# Patient Record
Sex: Female | Born: 1965 | Race: White | Hispanic: No | Marital: Married | Smoking: Never smoker
Health system: Southern US, Community
[De-identification: ages and names within clinical notes are randomized; demographics above are authoritative.]

## PROBLEM LIST (undated history)

## (undated) DIAGNOSIS — Z9889 Other specified postprocedural states: Secondary | ICD-10-CM

## (undated) DIAGNOSIS — Z808 Family history of malignant neoplasm of other organs or systems: Secondary | ICD-10-CM

## (undated) DIAGNOSIS — Z973 Presence of spectacles and contact lenses: Secondary | ICD-10-CM

## (undated) DIAGNOSIS — R112 Nausea with vomiting, unspecified: Secondary | ICD-10-CM

## (undated) DIAGNOSIS — E039 Hypothyroidism, unspecified: Secondary | ICD-10-CM

## (undated) DIAGNOSIS — Z8042 Family history of malignant neoplasm of prostate: Secondary | ICD-10-CM

## (undated) DIAGNOSIS — Z803 Family history of malignant neoplasm of breast: Secondary | ICD-10-CM

## (undated) DIAGNOSIS — Z8041 Family history of malignant neoplasm of ovary: Secondary | ICD-10-CM

## (undated) DIAGNOSIS — I10 Essential (primary) hypertension: Secondary | ICD-10-CM

## (undated) DIAGNOSIS — Z8489 Family history of other specified conditions: Secondary | ICD-10-CM

## (undated) HISTORY — DX: Family history of malignant neoplasm of breast: Z80.3

## (undated) HISTORY — DX: Hypothyroidism, unspecified: E03.9

## (undated) HISTORY — PX: WRIST GANGLION EXCISION: SUR520

## (undated) HISTORY — DX: Family history of malignant neoplasm of prostate: Z80.42

## (undated) HISTORY — DX: Family history of malignant neoplasm of other organs or systems: Z80.8

## (undated) HISTORY — PX: WISDOM TOOTH EXTRACTION: SHX21

## (undated) HISTORY — DX: Family history of malignant neoplasm of ovary: Z80.41

---

## 1998-09-15 ENCOUNTER — Other Ambulatory Visit: Admission: RE | Admit: 1998-09-15 | Discharge: 1998-09-15 | Payer: Self-pay | Admitting: *Deleted

## 1999-04-11 ENCOUNTER — Inpatient Hospital Stay (HOSPITAL_COMMUNITY): Admission: AD | Admit: 1999-04-11 | Discharge: 1999-04-14 | Payer: Self-pay | Admitting: Obstetrics and Gynecology

## 1999-05-24 ENCOUNTER — Other Ambulatory Visit: Admission: RE | Admit: 1999-05-24 | Discharge: 1999-05-24 | Payer: Self-pay | Admitting: Obstetrics and Gynecology

## 1999-12-20 ENCOUNTER — Other Ambulatory Visit: Admission: RE | Admit: 1999-12-20 | Discharge: 1999-12-20 | Payer: Self-pay | Admitting: Obstetrics and Gynecology

## 2000-06-14 ENCOUNTER — Inpatient Hospital Stay (HOSPITAL_COMMUNITY): Admission: AD | Admit: 2000-06-14 | Discharge: 2000-06-14 | Payer: Self-pay | Admitting: Obstetrics and Gynecology

## 2000-07-03 ENCOUNTER — Inpatient Hospital Stay (HOSPITAL_COMMUNITY): Admission: AD | Admit: 2000-07-03 | Discharge: 2000-07-07 | Payer: Self-pay | Admitting: *Deleted

## 2000-08-10 ENCOUNTER — Other Ambulatory Visit: Admission: RE | Admit: 2000-08-10 | Discharge: 2000-08-10 | Payer: Self-pay | Admitting: Obstetrics and Gynecology

## 2001-02-12 ENCOUNTER — Other Ambulatory Visit: Admission: RE | Admit: 2001-02-12 | Discharge: 2001-02-12 | Payer: Self-pay | Admitting: Obstetrics and Gynecology

## 2001-08-01 ENCOUNTER — Encounter: Payer: Self-pay | Admitting: Obstetrics and Gynecology

## 2001-08-01 ENCOUNTER — Ambulatory Visit (HOSPITAL_COMMUNITY): Admission: RE | Admit: 2001-08-01 | Discharge: 2001-08-01 | Payer: Self-pay | Admitting: Obstetrics and Gynecology

## 2001-08-14 ENCOUNTER — Ambulatory Visit (HOSPITAL_COMMUNITY): Admission: AD | Admit: 2001-08-14 | Discharge: 2001-08-14 | Payer: Self-pay | Admitting: Obstetrics and Gynecology

## 2001-08-14 ENCOUNTER — Encounter: Payer: Self-pay | Admitting: Obstetrics and Gynecology

## 2001-08-15 ENCOUNTER — Inpatient Hospital Stay (HOSPITAL_COMMUNITY): Admission: AD | Admit: 2001-08-15 | Discharge: 2001-08-18 | Payer: Self-pay | Admitting: Obstetrics and Gynecology

## 2001-10-25 ENCOUNTER — Other Ambulatory Visit: Admission: RE | Admit: 2001-10-25 | Discharge: 2001-10-25 | Payer: Self-pay | Admitting: Obstetrics and Gynecology

## 2002-05-29 ENCOUNTER — Encounter: Payer: Self-pay | Admitting: Obstetrics and Gynecology

## 2002-05-29 ENCOUNTER — Encounter: Admission: RE | Admit: 2002-05-29 | Discharge: 2002-05-29 | Payer: Self-pay | Admitting: Obstetrics and Gynecology

## 2002-10-24 ENCOUNTER — Other Ambulatory Visit: Admission: RE | Admit: 2002-10-24 | Discharge: 2002-10-24 | Payer: Self-pay | Admitting: Obstetrics and Gynecology

## 2003-10-30 ENCOUNTER — Other Ambulatory Visit: Admission: RE | Admit: 2003-10-30 | Discharge: 2003-10-30 | Payer: Self-pay | Admitting: Obstetrics and Gynecology

## 2004-09-20 ENCOUNTER — Encounter: Admission: RE | Admit: 2004-09-20 | Discharge: 2004-09-20 | Payer: Self-pay | Admitting: Obstetrics and Gynecology

## 2004-11-08 ENCOUNTER — Other Ambulatory Visit: Admission: RE | Admit: 2004-11-08 | Discharge: 2004-11-08 | Payer: Self-pay | Admitting: Obstetrics and Gynecology

## 2005-11-23 ENCOUNTER — Other Ambulatory Visit: Admission: RE | Admit: 2005-11-23 | Discharge: 2005-11-23 | Payer: Self-pay | Admitting: Obstetrics and Gynecology

## 2006-01-02 ENCOUNTER — Encounter: Admission: RE | Admit: 2006-01-02 | Discharge: 2006-01-02 | Payer: Self-pay | Admitting: Obstetrics and Gynecology

## 2007-01-04 ENCOUNTER — Encounter: Admission: RE | Admit: 2007-01-04 | Discharge: 2007-01-04 | Payer: Self-pay | Admitting: Obstetrics and Gynecology

## 2007-12-17 ENCOUNTER — Emergency Department (HOSPITAL_COMMUNITY): Admission: EM | Admit: 2007-12-17 | Discharge: 2007-12-17 | Payer: Self-pay | Admitting: Emergency Medicine

## 2008-01-27 ENCOUNTER — Encounter: Admission: RE | Admit: 2008-01-27 | Discharge: 2008-01-27 | Payer: Self-pay | Admitting: Obstetrics and Gynecology

## 2008-02-28 HISTORY — PX: PLACEMENT OF BREAST IMPLANTS: SHX6334

## 2009-01-27 ENCOUNTER — Encounter: Admission: RE | Admit: 2009-01-27 | Discharge: 2009-01-27 | Payer: Self-pay | Admitting: Obstetrics and Gynecology

## 2010-03-30 ENCOUNTER — Encounter: Payer: Self-pay | Admitting: Obstetrics and Gynecology

## 2011-01-11 ENCOUNTER — Other Ambulatory Visit: Payer: Self-pay | Admitting: Obstetrics and Gynecology

## 2011-01-11 DIAGNOSIS — Z1231 Encounter for screening mammogram for malignant neoplasm of breast: Secondary | ICD-10-CM

## 2011-02-08 ENCOUNTER — Ambulatory Visit
Admission: RE | Admit: 2011-02-08 | Discharge: 2011-02-08 | Disposition: A | Discharge status: Home / Self Care | Payer: BC Managed Care – PPO | Source: Ambulatory Visit | Attending: Obstetrics and Gynecology | Admitting: Obstetrics and Gynecology

## 2011-02-08 DIAGNOSIS — Z1231 Encounter for screening mammogram for malignant neoplasm of breast: Secondary | ICD-10-CM

## 2011-02-15 ENCOUNTER — Other Ambulatory Visit: Payer: Self-pay | Admitting: Obstetrics and Gynecology

## 2011-02-15 DIAGNOSIS — R928 Other abnormal and inconclusive findings on diagnostic imaging of breast: Secondary | ICD-10-CM

## 2011-03-07 ENCOUNTER — Ambulatory Visit
Admission: RE | Admit: 2011-03-07 | Discharge: 2011-03-07 | Disposition: A | Discharge status: Home / Self Care | Payer: BC Managed Care – PPO | Source: Ambulatory Visit | Attending: Obstetrics and Gynecology | Admitting: Obstetrics and Gynecology

## 2011-03-07 DIAGNOSIS — R928 Other abnormal and inconclusive findings on diagnostic imaging of breast: Secondary | ICD-10-CM

## 2011-11-08 ENCOUNTER — Telehealth: Payer: Self-pay | Admitting: Obstetrics and Gynecology

## 2011-11-08 NOTE — Telephone Encounter (Signed)
LM for pt to call.  Unsure of msg that was left.  ld

## 2012-01-01 ENCOUNTER — Other Ambulatory Visit: Payer: Self-pay | Admitting: Obstetrics and Gynecology

## 2012-01-01 DIAGNOSIS — Z1231 Encounter for screening mammogram for malignant neoplasm of breast: Secondary | ICD-10-CM

## 2012-01-17 ENCOUNTER — Ambulatory Visit: Payer: Self-pay | Admitting: Obstetrics and Gynecology

## 2012-02-09 ENCOUNTER — Encounter: Payer: Self-pay | Admitting: Obstetrics and Gynecology

## 2012-02-09 ENCOUNTER — Other Ambulatory Visit: Payer: Self-pay | Admitting: Obstetrics and Gynecology

## 2012-02-09 ENCOUNTER — Ambulatory Visit (INDEPENDENT_AMBULATORY_CARE_PROVIDER_SITE_OTHER): Payer: BC Managed Care – PPO

## 2012-02-09 ENCOUNTER — Ambulatory Visit (INDEPENDENT_AMBULATORY_CARE_PROVIDER_SITE_OTHER): Payer: BC Managed Care – PPO | Admitting: Obstetrics and Gynecology

## 2012-02-09 VITALS — BP 120/68 | Ht 66.25 in | Wt 132.0 lb

## 2012-02-09 DIAGNOSIS — Z8543 Personal history of malignant neoplasm of ovary: Secondary | ICD-10-CM

## 2012-02-09 DIAGNOSIS — Z124 Encounter for screening for malignant neoplasm of cervix: Secondary | ICD-10-CM

## 2012-02-09 DIAGNOSIS — Z01419 Encounter for gynecological examination (general) (routine) without abnormal findings: Secondary | ICD-10-CM

## 2012-02-09 DIAGNOSIS — Z8041 Family history of malignant neoplasm of ovary: Secondary | ICD-10-CM | POA: Insufficient documentation

## 2012-02-09 MED ORDER — ETONOGESTREL-ETHINYL ESTRADIOL 0.12-0.015 MG/24HR VA RING: VAGINAL_RING | VAGINAL | Status: DC

## 2012-02-09 NOTE — Progress Notes (Signed)
Subjective:    Renee Keith is a 46 y.o. female, No obstetric history on file., who presents for an annual exam.   The patient reports:no complaints  Contraception:NuvaRing vaginal inserts  Last mammogram: was normal and not applicable January 2008. Mammogram sched 02/2012 Last pap: was normal and not applicable October  2012  GC/Chlamydia cultures offered: declined HIV/RPR/HbsAg offered:  declined HSV 1 and 2 glycoprotein offered: declined  Menstrual cycle regular and monthly: Yes Menstrual flow normal: No: less Cycles last approx 3 days vs 5days in past  Urinary symptoms: none Normal bowel movements: Yes Reports abuse at home: No:        History   Social History  . Marital Status: Married    Spouse Name: N/A    Number of Children: N/A  . Years of Education: N/A   Social History Main Topics  . Smoking status: Not on file  . Smokeless tobacco: Not on file  . Alcohol Use: Not on file  . Drug Use: Not on file  . Sexually Active: Not on file   Other Topics Concern  . Not on file   Social History Narrative  . No narrative on file    Menstrual cycle:   LMP: No LMP recorded.           Cycle: 28-30day  The following portions of the patient's history were reviewed and updated as appropriate: allergies, current medications, past family history, past medical history, past social history, past surgical history and problem list.  Review of Systems Pertinent items are noted in HPI. Breast:Negative for breast lump,nipple discharge or nipple retraction Gastrointestinal: Negative for abdominal pain, change in bowel habits or rectal bleeding Urinary:negative   Objective:    There were no vitals taken for this visit.    Weight:  Wt Readings from Last 1 Encounters:  No data found for Wt          BMI: There is no height or weight on file to calculate BMI.  General Appearance: Alert, appropriate appearance for age. No acute distress HEENT: Grossly normal Neck / Thyroid:  Supple, no masses, nodes or enlargement Lungs: clear to auscultation bilaterally Back: No CVA tenderness Breast Exam: No masses or nodes.No dimpling, nipple retraction or discharge. Cardiovascular: Regular rate and rhythm. S1, S2, no murmur Gastrointestinal: Soft, non-tender, no masses or organomegaly Pelvic Exam: Vulva and vagina appear normal. Bimanual exam reveals normal uterus and adnexa. Rectovaginal: normal rectal, no masses Lymphatic Exam: Non-palpable nodes in neck, clavicular, axillary, or inguinal regions Skin: no rash or abnormalities Neurologic: Normal gait and speech, no tremor  Psychiatric: Alert and oriented, appropriate affect.    Assessment:    Normal gyn exam family H/O ovarian cancer    Plan:    mammogram pap smear return annually or prn STD screening: declined Contraception:NuvaRing vaginal inserts Plan FSH testing at 49 and prophylactic BSO   Silverio Lay MD

## 2012-02-12 LAB — PAP IG W/ RFLX HPV ASCU

## 2012-02-28 HISTORY — PX: BREAST IMPLANT REMOVAL: SUR1101

## 2013-10-27 ENCOUNTER — Other Ambulatory Visit: Payer: Self-pay

## 2013-10-27 DIAGNOSIS — Z1231 Encounter for screening mammogram for malignant neoplasm of breast: Secondary | ICD-10-CM

## 2013-12-02 ENCOUNTER — Ambulatory Visit
Admission: RE | Admit: 2013-12-02 | Discharge: 2013-12-02 | Disposition: A | Discharge status: Home / Self Care | Payer: BC Managed Care – PPO | Source: Ambulatory Visit

## 2013-12-02 DIAGNOSIS — Z1231 Encounter for screening mammogram for malignant neoplasm of breast: Secondary | ICD-10-CM

## 2013-12-29 ENCOUNTER — Encounter: Payer: Self-pay | Admitting: Obstetrics and Gynecology

## 2014-11-04 ENCOUNTER — Other Ambulatory Visit: Payer: Self-pay

## 2014-11-04 DIAGNOSIS — Z1231 Encounter for screening mammogram for malignant neoplasm of breast: Secondary | ICD-10-CM

## 2014-12-15 ENCOUNTER — Ambulatory Visit: Payer: Self-pay

## 2015-01-06 ENCOUNTER — Ambulatory Visit
Admission: RE | Admit: 2015-01-06 | Discharge: 2015-01-06 | Disposition: A | Discharge status: Home / Self Care | Payer: BLUE CROSS/BLUE SHIELD | Source: Ambulatory Visit

## 2015-01-06 DIAGNOSIS — Z1231 Encounter for screening mammogram for malignant neoplasm of breast: Secondary | ICD-10-CM

## 2015-10-20 DIAGNOSIS — E039 Hypothyroidism, unspecified: Secondary | ICD-10-CM | POA: Diagnosis not present

## 2015-10-22 DIAGNOSIS — Z23 Encounter for immunization: Secondary | ICD-10-CM | POA: Diagnosis not present

## 2015-10-22 DIAGNOSIS — E039 Hypothyroidism, unspecified: Secondary | ICD-10-CM | POA: Diagnosis not present

## 2015-10-22 DIAGNOSIS — I10 Essential (primary) hypertension: Secondary | ICD-10-CM | POA: Diagnosis not present

## 2015-11-24 DIAGNOSIS — M67432 Ganglion, left wrist: Secondary | ICD-10-CM | POA: Diagnosis not present

## 2015-11-24 DIAGNOSIS — M2021 Hallux rigidus, right foot: Secondary | ICD-10-CM | POA: Diagnosis not present

## 2016-02-22 ENCOUNTER — Other Ambulatory Visit: Payer: Self-pay | Admitting: Obstetrics and Gynecology

## 2016-02-22 DIAGNOSIS — Z1231 Encounter for screening mammogram for malignant neoplasm of breast: Secondary | ICD-10-CM

## 2016-02-28 HISTORY — PX: BUNIONECTOMY: SHX129

## 2016-03-09 DIAGNOSIS — M79671 Pain in right foot: Secondary | ICD-10-CM | POA: Diagnosis not present

## 2016-03-17 DIAGNOSIS — M79671 Pain in right foot: Secondary | ICD-10-CM | POA: Diagnosis not present

## 2016-03-21 ENCOUNTER — Ambulatory Visit: Payer: BLUE CROSS/BLUE SHIELD

## 2016-04-06 DIAGNOSIS — L718 Other rosacea: Secondary | ICD-10-CM | POA: Diagnosis not present

## 2016-04-06 DIAGNOSIS — L720 Epidermal cyst: Secondary | ICD-10-CM | POA: Diagnosis not present

## 2016-04-06 DIAGNOSIS — I788 Other diseases of capillaries: Secondary | ICD-10-CM | POA: Diagnosis not present

## 2016-04-13 DIAGNOSIS — M2021 Hallux rigidus, right foot: Secondary | ICD-10-CM | POA: Diagnosis not present

## 2016-04-26 DIAGNOSIS — I10 Essential (primary) hypertension: Secondary | ICD-10-CM | POA: Diagnosis not present

## 2016-04-26 DIAGNOSIS — E039 Hypothyroidism, unspecified: Secondary | ICD-10-CM | POA: Diagnosis not present

## 2016-04-26 DIAGNOSIS — Z0000 Encounter for general adult medical examination without abnormal findings: Secondary | ICD-10-CM | POA: Diagnosis not present

## 2016-05-11 DIAGNOSIS — Z8041 Family history of malignant neoplasm of ovary: Secondary | ICD-10-CM | POA: Diagnosis not present

## 2016-05-11 DIAGNOSIS — Z1231 Encounter for screening mammogram for malignant neoplasm of breast: Secondary | ICD-10-CM | POA: Diagnosis not present

## 2016-05-25 DIAGNOSIS — Z01419 Encounter for gynecological examination (general) (routine) without abnormal findings: Secondary | ICD-10-CM | POA: Diagnosis not present

## 2016-05-25 DIAGNOSIS — Z6823 Body mass index (BMI) 23.0-23.9, adult: Secondary | ICD-10-CM | POA: Diagnosis not present

## 2016-05-25 DIAGNOSIS — Z1231 Encounter for screening mammogram for malignant neoplasm of breast: Secondary | ICD-10-CM | POA: Diagnosis not present

## 2016-05-25 DIAGNOSIS — Z124 Encounter for screening for malignant neoplasm of cervix: Secondary | ICD-10-CM | POA: Diagnosis not present

## 2016-08-24 DIAGNOSIS — M2021 Hallux rigidus, right foot: Secondary | ICD-10-CM | POA: Diagnosis not present

## 2016-09-06 DIAGNOSIS — L570 Actinic keratosis: Secondary | ICD-10-CM | POA: Diagnosis not present

## 2016-09-12 DIAGNOSIS — Z880 Allergy status to penicillin: Secondary | ICD-10-CM | POA: Diagnosis not present

## 2016-09-12 DIAGNOSIS — M899 Disorder of bone, unspecified: Secondary | ICD-10-CM | POA: Diagnosis not present

## 2016-09-12 DIAGNOSIS — M2021 Hallux rigidus, right foot: Secondary | ICD-10-CM | POA: Diagnosis not present

## 2016-09-12 DIAGNOSIS — M949 Disorder of cartilage, unspecified: Secondary | ICD-10-CM | POA: Diagnosis not present

## 2016-09-12 DIAGNOSIS — M948X7 Other specified disorders of cartilage, ankle and foot: Secondary | ICD-10-CM | POA: Diagnosis not present

## 2016-11-03 DIAGNOSIS — I10 Essential (primary) hypertension: Secondary | ICD-10-CM | POA: Diagnosis not present

## 2016-11-03 DIAGNOSIS — E039 Hypothyroidism, unspecified: Secondary | ICD-10-CM | POA: Diagnosis not present

## 2016-11-03 DIAGNOSIS — Z23 Encounter for immunization: Secondary | ICD-10-CM | POA: Diagnosis not present

## 2016-11-10 DIAGNOSIS — Z3040 Encounter for surveillance of contraceptives, unspecified: Secondary | ICD-10-CM | POA: Diagnosis not present

## 2016-12-07 DIAGNOSIS — L570 Actinic keratosis: Secondary | ICD-10-CM | POA: Diagnosis not present

## 2016-12-07 DIAGNOSIS — L814 Other melanin hyperpigmentation: Secondary | ICD-10-CM | POA: Diagnosis not present

## 2016-12-07 DIAGNOSIS — D2272 Melanocytic nevi of left lower limb, including hip: Secondary | ICD-10-CM | POA: Diagnosis not present

## 2016-12-07 DIAGNOSIS — M79671 Pain in right foot: Secondary | ICD-10-CM | POA: Diagnosis not present

## 2016-12-07 DIAGNOSIS — I788 Other diseases of capillaries: Secondary | ICD-10-CM | POA: Diagnosis not present

## 2016-12-07 DIAGNOSIS — L821 Other seborrheic keratosis: Secondary | ICD-10-CM | POA: Diagnosis not present

## 2016-12-11 DIAGNOSIS — M79671 Pain in right foot: Secondary | ICD-10-CM | POA: Diagnosis not present

## 2016-12-14 DIAGNOSIS — M79671 Pain in right foot: Secondary | ICD-10-CM | POA: Diagnosis not present

## 2016-12-25 DIAGNOSIS — M79671 Pain in right foot: Secondary | ICD-10-CM | POA: Diagnosis not present

## 2017-01-01 DIAGNOSIS — M79671 Pain in right foot: Secondary | ICD-10-CM | POA: Diagnosis not present

## 2017-01-04 DIAGNOSIS — M79671 Pain in right foot: Secondary | ICD-10-CM | POA: Diagnosis not present

## 2017-01-15 DIAGNOSIS — M79671 Pain in right foot: Secondary | ICD-10-CM | POA: Diagnosis not present

## 2017-01-23 DIAGNOSIS — M79671 Pain in right foot: Secondary | ICD-10-CM | POA: Diagnosis not present

## 2017-05-28 DIAGNOSIS — Z01419 Encounter for gynecological examination (general) (routine) without abnormal findings: Secondary | ICD-10-CM | POA: Diagnosis not present

## 2017-05-28 DIAGNOSIS — Z1231 Encounter for screening mammogram for malignant neoplasm of breast: Secondary | ICD-10-CM | POA: Diagnosis not present

## 2017-05-28 DIAGNOSIS — Z842 Family history of other diseases of the genitourinary system: Secondary | ICD-10-CM | POA: Diagnosis not present

## 2017-05-31 DIAGNOSIS — Z0000 Encounter for general adult medical examination without abnormal findings: Secondary | ICD-10-CM | POA: Diagnosis not present

## 2017-05-31 DIAGNOSIS — I10 Essential (primary) hypertension: Secondary | ICD-10-CM | POA: Diagnosis not present

## 2017-05-31 DIAGNOSIS — E559 Vitamin D deficiency, unspecified: Secondary | ICD-10-CM | POA: Diagnosis not present

## 2017-05-31 DIAGNOSIS — E039 Hypothyroidism, unspecified: Secondary | ICD-10-CM | POA: Diagnosis not present

## 2017-06-11 DIAGNOSIS — Z0100 Encounter for examination of eyes and vision without abnormal findings: Secondary | ICD-10-CM | POA: Diagnosis not present

## 2017-07-13 DIAGNOSIS — L237 Allergic contact dermatitis due to plants, except food: Secondary | ICD-10-CM | POA: Diagnosis not present

## 2017-09-11 DIAGNOSIS — Z3040 Encounter for surveillance of contraceptives, unspecified: Secondary | ICD-10-CM | POA: Diagnosis not present

## 2017-09-11 DIAGNOSIS — Z01419 Encounter for gynecological examination (general) (routine) without abnormal findings: Secondary | ICD-10-CM | POA: Diagnosis not present

## 2017-09-11 DIAGNOSIS — Z8041 Family history of malignant neoplasm of ovary: Secondary | ICD-10-CM | POA: Diagnosis not present

## 2017-09-11 DIAGNOSIS — Z6823 Body mass index (BMI) 23.0-23.9, adult: Secondary | ICD-10-CM | POA: Diagnosis not present

## 2017-09-27 ENCOUNTER — Telehealth: Payer: Self-pay | Admitting: Licensed Clinical Social Worker

## 2017-09-27 ENCOUNTER — Encounter: Payer: Self-pay | Admitting: Licensed Clinical Social Worker

## 2017-09-27 NOTE — Telephone Encounter (Signed)
A genetic counseling appt has been scheduled for the pt to see Ike BeneBrianna Teapole on 8/26 at 1pm. Unable to reach the pt. Letter mailed with the appt information.

## 2017-10-05 DIAGNOSIS — Z3040 Encounter for surveillance of contraceptives, unspecified: Secondary | ICD-10-CM | POA: Diagnosis not present

## 2017-10-19 ENCOUNTER — Encounter: Payer: Self-pay | Admitting: Licensed Clinical Social Worker

## 2017-10-22 ENCOUNTER — Encounter: Payer: BLUE CROSS/BLUE SHIELD | Admitting: Licensed Clinical Social Worker

## 2017-10-22 ENCOUNTER — Other Ambulatory Visit: Payer: BLUE CROSS/BLUE SHIELD

## 2017-10-23 ENCOUNTER — Telehealth: Payer: Self-pay

## 2017-10-23 NOTE — Telephone Encounter (Signed)
Called Renee Keith and had patient to speak directly with her as a new patient reschedule. Per 8/27walk ins. Printed calender for patient records. And shredded old appointment paper

## 2017-11-20 ENCOUNTER — Inpatient Hospital Stay: Payer: BLUE CROSS/BLUE SHIELD | Attending: Genetic Counselor | Admitting: Genetics

## 2017-11-20 ENCOUNTER — Inpatient Hospital Stay: Payer: BLUE CROSS/BLUE SHIELD

## 2017-11-20 DIAGNOSIS — Z803 Family history of malignant neoplasm of breast: Secondary | ICD-10-CM | POA: Diagnosis not present

## 2017-11-20 DIAGNOSIS — Z8041 Family history of malignant neoplasm of ovary: Secondary | ICD-10-CM | POA: Diagnosis not present

## 2017-11-20 DIAGNOSIS — Z1379 Encounter for other screening for genetic and chromosomal anomalies: Secondary | ICD-10-CM

## 2017-11-20 DIAGNOSIS — Z8042 Family history of malignant neoplasm of prostate: Secondary | ICD-10-CM | POA: Diagnosis not present

## 2017-11-20 DIAGNOSIS — Z808 Family history of malignant neoplasm of other organs or systems: Secondary | ICD-10-CM

## 2017-11-21 ENCOUNTER — Encounter: Payer: Self-pay | Admitting: Genetics

## 2017-11-21 DIAGNOSIS — Z803 Family history of malignant neoplasm of breast: Secondary | ICD-10-CM | POA: Insufficient documentation

## 2017-11-21 DIAGNOSIS — Z8042 Family history of malignant neoplasm of prostate: Secondary | ICD-10-CM | POA: Insufficient documentation

## 2017-11-21 DIAGNOSIS — Z808 Family history of malignant neoplasm of other organs or systems: Secondary | ICD-10-CM | POA: Insufficient documentation

## 2017-11-21 NOTE — Progress Notes (Signed)
REFERRING PROVIDER: Delsa Bern, MD [address] [place], [place] [address]  PRIMARY PROVIDER:  Cari Caraway, MD  PRIMARY REASON FOR VISIT:  1. Family history of ovarian cancer   2. Family history of prostate cancer   3. Family history of melanoma   4. Family history of breast cancer     HISTORY OF PRESENT ILLNESS:   Renee Keith, a 52 y.o. female, was seen for a [place] cancer genetics consultation at the request of Dr. Cletis Media due to a family history of cancer.  Renee Keith presents to clinic today to discuss the possibility of a hereditary predisposition to cancer, genetic testing, and to further clarify her future cancer risks, as well as potential cancer risks for family members.   Renee Keith is a 52 y.o. female with no personal history of cancer.  She reports she had genetic testing 15 years ago for BRCA1/2 only.   This was long before panel testing, and before BART testing was available.   HORMONAL RISK FACTORS:  Menarche was at age 47.  First live birth at age 40.  OCP use: yes years.  Ovaries intact: yes. Reports getting transvaginal ultrasounds routinely for screening Hysterectomy: no.  Menopausal status: premenopausal.  HRT use: 0 years. Colonoscopy: yes; pt reports normal, next one in 10 yrs. Mammogram within the last year: yes. Number of breast biopsies: 0.  Past Medical History:  Diagnosis Date  . Family history of breast cancer   . Family history of melanoma   . Family history of ovarian cancer   . Family history of prostate cancer   . Hypothyroidism x12years    Past Surgical History:  Procedure Laterality Date  . WISDOM TOOTH EXTRACTION      Social History   Socioeconomic History  . Marital status: Married    Spouse name: Not on file  . Number of children: Not on file  . Years of education: Not on file  . Highest education level: Not on file  Occupational History  . Not on file  Social Needs  . Financial resource strain: Not on file  .  Food insecurity:    Worry: Not on file    Inability: Not on file  . Transportation needs:    Medical: Not on file    Non-medical: Not on file  Tobacco Use  . Smoking status: Never Smoker  . Smokeless tobacco: Never Used  Substance and Sexual Activity  . Alcohol use: Not on file  . Drug use: Not on file  . Sexual activity: Yes    Birth control/protection: Inserts  Lifestyle  . Physical activity:    Days per week: Not on file    Minutes per session: Not on file  . Stress: Not on file  Relationships  . Social connections:    Talks on phone: Not on file    Gets together: Not on file    Attends religious service: Not on file    Active member of club or organization: Not on file    Attends meetings of clubs or organizations: Not on file    Relationship status: Not on file  Other Topics Concern  . Not on file  Social History Narrative  . Not on file     FAMILY HISTORY:  We obtained a detailed, 4-generation family history.  Significant diagnoses are listed below: Family History  Problem Relation Age of Onset  . Ovarian cancer Mother 62  . Prostate cancer Father 93  died at 57, metastatic  . Melanoma Brother 49       2nd one at 51  . Prostate cancer Maternal Uncle 47  . Prostate cancer Maternal Uncle 67  . Breast cancer Cousin 61  . Breast cancer Other 72    Renee Keith has 3 teenage sons with no history of cancer.  She has a brother who has a history of melanoma-the first on his leg at 18, and more recently he has one dx at 46 on his head (he has been bald for several years). He has 2 children.  He had some genetic testing recently that was reportedly negative as far as the patient is aware.    Renee Keith father: died at 55 due to metastatic prostate cancer dx at 44 Paternal Aunts/Uncles: 1 paternal aunt died I her 47's with no hx of cancer.  Paternal cousins: no hx of cancer Paternal grandfather: died in his 29's with no hx of cancer Paternal grandmother:died at 24 with  no hx of cancer.   Renee Keith mother: died at 38 due to ovarian cancer dx at 72.   Maternal Aunts/Uncles: 2 maternal aunts and 2 maternal uncles: -1 maternal uncle dx with prostate cancer at 63, now in his 38's -1 maternal uncle dx with prostate cancer at 70, now in his 74's -2 maternal aunts (ages 30's and 46) with no hx of cancer, both have had hysterectomies.  Maternal cousins: 1 maternal cousin with breast cancer dx at 21.  Maternal grandfather: died in his early 65's due to stroke Maternal grandmother:died at 42 with no hx of cancer.  She has a sister (patient's great aunt) who died of breast cancer in her 55's.   Patient's maternal ancestors are of N. European descent, and paternal ancestors are of N. European descent. There is no reported Ashkenazi Jewish ancestry. There is no known consanguinity.  GENETIC COUNSELING ASSESSMENT: Renee Keith is a 52 y.o. female with a family history which is somewhat suggestive of a Hereditary Cancer Predisposition Syndrome. We, therefore, discussed and recommended the following at today's visit.   DISCUSSION: We reviewed the characteristics, features and inheritance patterns of hereditary cancer syndromes. We also discussed genetic testing, including the appropriate family members to test, the process of testing, insurance coverage and turn-around-time for results. We discussed the implications of a negative, positive and/or variant of uncertain significant result. We recommended Renee Keith pursue genetic testing for the Breast and Gyn cancers panel with the option to reflex tot he Multi-cancer panel.   The Multi-Cancer Panel offered by [facility] includes sequencing and/or deletion duplication testing of the following 91 genes: AIP, ALK, APC, ATM, AXIN2, BAP1, BARD1, BLM, BMPR1A, BRCA1, BRCA2, BRIP1, BUB1B, CASR, CDC73, CDH1, CDK4, CDKN1B, CDKN1C, CDKN2A, CEBPA, CEP57, CHEK2, CTNNA1, DICER1, DIS3L2, EGFR, ENG, EPCAM, FH, FLCN, GALNT12, GATA2, GPC3, GREM1, HOXB13,  HRAS, KIT, MAX, MEN1, MET, MITF, MLH1, MLH3, MSH2, MSH3, MSH6, MUTYH, NBN, NF1, NF2, NTHL1, PALB2, PDGFRA, PHOX2B, PMS2, POLD1, POLE, POT1, PRKAR1A, PTCH1, PTEN, RAD50, RAD51C, RAD51D, RB1, RECQL4, RET, RNF43, RPS20, RUNX1, SDHA, SDHAF2, SDHB, SDHC, SDHD, SMAD4, SMARCA4, SMARCB1, SMARCE1, STK11, SUFU, TERC, TERT, TMEM127, TP53, TSC1, TSC2, VHL, WRN, WT1  We discussed that only 5-10% of cancers are associated with a Hereditary cancer predisposition syndrome.  One of the most common hereditary cancer syndromes that increases breast cancer risk is called Hereditary Breast and Ovarian Cancer (HBOC) syndrome.  This syndrome is caused by mutations in the BRCA1 and BRCA2 genes.  This syndrome increases an  individual's lifetime risk to develop breast, ovarian, pancreatic, and other types of cancer.  There are also many other cancer predisposition syndromes caused by mutations in several other genes.  We discussed that if she is found to have a mutation in one of these genes, it may impact future medical management recommendations such as increased cancer screenings and consideration of risk reducing surgeries.  A positive result could also have implications for the patient's family members.  A Negative result would mean we were unable to identify a hereditary predisposition to cancer in her cancer, but does not rule out the possibility of a hereditary risk for cancer.  There could be mutations that are undetectable by current technology, or in genes not yet tested or identified to increase cancer risk.    We discussed the potential to find a Variant of Uncertain Significance or VUS.  These are variants that have not yet been identified as pathogenic or benign, and it is unknown if this variant is associated with increased cancer risk or if this is a normal finding.  Most VUS's are reclassified to benign or likely benign.   It should not be used to make medical management decisions. With time, we suspect the lab will  determine the significance of any VUS's identified if any.   Based on Ms. Beaubien family history of cancer, she meets medical criteria for genetic testing. Despite that she meets criteria, she may still have an out of pocket cost. The laboratory can provide her with an estimate of her OOP cost.  she was given the contact information for the laboratory if she has further questions. .   Based on the patient's personal and family history, the statistical model (Tyrer Cusik)   Was used to estimate her risk of developing breast cancer. This estimates her lifetime risk of developing breast cancer to be approximately 7.5%. This estimation is performed in the setting negative genetic test results.  A positive result may significantly impact this risk assessment.  The patient's lifetime breast cancer risk is a preliminary estimate based on available information using one of several models endorsed by the [place] (ACS). The ACS recommends consideration of breast MRI screening as an adjunct to mammography for patients at high risk (defined as 20% or greater lifetime risk). A more detailed breast cancer risk assessment can be considered, if clinically indicated.    We discussed that some people do not want to undergo genetic testing due to fear of genetic discrimination.  A federal law called the Genetic Information Non-Discrimination Act (GINA) of 2008 helps protect individuals against genetic discrimination based on their genetic test results.  It impacts both health insurance and employment.  For health insurance, it protects against increased premiums, being kicked off insurance or being forced to take a test in order to be insured.  For employment it protects against hiring, firing and promoting decisions based on genetic test results.  Health status due to a cancer diagnosis is not protected under GINA.  This law does not protect life insurance, disability insurance, or other types of insurance.    PLAN: After considering the risks, benefits, and limitations, Ms. Mcbreen  provided informed consent to pursue genetic testing and the blood sample was sent to [facility] for analysis of the Breast/GYN panel with planned reflex to Multi-Cancer Panel. Results should be available within approximately 2-3 weeks' time, at which point they will be disclosed by telephone to Renee Keith, as will any additional recommendations warranted by these  results. Ms. Hargett will receive a summary of her genetic counseling visit and a copy of her results once available. This information will also be available in Epic. We encouraged Ms. Berroa to remain in contact with cancer genetics annually so that we can continuously update the family history and inform her of any changes in cancer genetics and testing that may be of benefit for her family. Ms. Grennan questions were answered to her satisfaction today. Our contact information was provided should additional questions or concerns arise.  Based on Ms. Mervine family history, we recommended her maternal and paternal relatives (especially those dx with cancer) also, have genetic counseling and testing. Ms. Demedeiros will let us know if we can be of any assistance in coordinating genetic counseling and/or testing for this family member.   Lastly, we encouraged Ms. Krichbaum to remain in contact with cancer genetics annually so that we can continuously update the family history and inform her of any changes in cancer genetics and testing that may be of benefit for this family.   Ms.  Fleet questions were answered to her satisfaction today. Our contact information was provided should additional questions or concerns arise. Thank you for the referral and allowing [place] to share in the care of your patient.   Tana Felts, MS, [facility] Certified Genetic Counselor [email or web address] phone: [phone]  The patient was seen for a total of 35 minutes in face-to-face genetic counseling.    This patient was discussed with Drs. Magrinat, Lindi Adie and/or Burr Medico who agrees with the above.

## 2017-12-04 ENCOUNTER — Telehealth: Payer: Self-pay | Admitting: Genetics

## 2017-12-04 ENCOUNTER — Encounter: Payer: Self-pay | Admitting: Genetics

## 2017-12-04 ENCOUNTER — Ambulatory Visit: Payer: Self-pay | Admitting: Genetics

## 2017-12-04 DIAGNOSIS — Z808 Family history of malignant neoplasm of other organs or systems: Secondary | ICD-10-CM

## 2017-12-04 DIAGNOSIS — Z1379 Encounter for other screening for genetic and chromosomal anomalies: Secondary | ICD-10-CM

## 2017-12-04 DIAGNOSIS — Z8042 Family history of malignant neoplasm of prostate: Secondary | ICD-10-CM

## 2017-12-04 DIAGNOSIS — Z803 Family history of malignant neoplasm of breast: Secondary | ICD-10-CM

## 2017-12-04 DIAGNOSIS — Z8041 Family history of malignant neoplasm of ovary: Secondary | ICD-10-CM

## 2017-12-04 NOTE — Telephone Encounter (Signed)
Revealed negative genetic testing.  This normal result is reassuring and indicates that it is unlikely Renee Keith has a hereditary predisposition to cancer due to a mutation in these genes.   However, genetic testing is not perfect, and cannot definitively rule out a hereditary cause.  It will be important for her to keep in contact with genetics to learn if any additional testing may be needed in the future.     She should continue her medical care based on her healthcare providers' recommendations and make sure they are aware of her family history.   We also recommended that relatives on both sides of the family also have genetic testing

## 2017-12-04 NOTE — Progress Notes (Signed)
HPI:  Ms. Kohen was previously seen in the [facility] on 11/20/2017 due to a family history of cancer and concerns regarding a hereditary predisposition to cancer. Please refer to our prior cancer genetics clinic note for more information regarding Ms. Rohrer's medical, social and family histories, and our assessment and recommendations, at the time. Ms. Wilmore recent genetic test results were disclosed to her, as well as recommendations warranted by these results. These results and recommendations are discussed in more detail below.  CANCER HISTORY:   No history exists.     FAMILY HISTORY:  We obtained a detailed, 4-generation family history.  Significant diagnoses are listed below: Family History  Problem Relation Age of Onset  . Ovarian cancer Mother 26  . Prostate cancer Father 46       died at 20, metastatic  . Melanoma Brother 36       2nd one at 35  . Prostate cancer Maternal Uncle 47  . Prostate cancer Maternal Uncle 67  . Breast cancer Cousin 30  . Breast cancer Other 63    Ms. Duet has 3 teenage sons with no history of cancer.  She has a brother who has a history of melanoma-the first on his leg at 24, and more recently he has one dx at 58 on his head (he has been bald for several years). He has 2 children.  He had some genetic testing recently that was reportedly negative as far as the patient is aware.    Ms. Munar father: died at 83 due to metastatic prostate cancer dx at 29 Paternal Aunts/Uncles: 1 paternal aunt died I her 81's with no hx of cancer.  Paternal cousins: no hx of cancer Paternal grandfather: died in his 54's with no hx of cancer Paternal grandmother:died at 35 with no hx of cancer.   Ms. Strahan mother: died at 47 due to ovarian cancer dx at 90.   Maternal Aunts/Uncles: 2 maternal aunts and 2 maternal uncles: -1 maternal uncle dx with prostate cancer at 20, now in his 72's -1 maternal uncle dx with prostate cancer at 32, now in his 75's -2  maternal aunts (ages 49's and 75) with no hx of cancer, both have had hysterectomies.  Maternal cousins: 1 maternal cousin with breast cancer dx at 72.  Maternal grandfather: died in his early 48's due to stroke Maternal grandmother:died at 68 with no hx of cancer.  She has a sister (patient's great aunt) who died of breast cancer in her 1's.   Patient's maternal ancestors are of N. European descent, and paternal ancestors are of N. European descent. There is no reported Ashkenazi Jewish ancestry. There is no known consanguinity.  GENETIC TEST RESULTS: Genetic testing performed through [facility]'s Multi-Cancer panel reported out on 12/04/2017 showed no pathogenic mutations. The Multi-Cancer Panel offered by [facility] includes sequencing and/or deletion duplication testing of the following 91 genes: AIP, ALK, APC, ATM, AXIN2, BAP1, BARD1, BLM, BMPR1A, BRCA1, BRCA2, BRIP1, BUB1B, CASR, CDC73, CDH1, CDK4, CDKN1B, CDKN1C, CDKN2A, CEBPA, CEP57, CHEK2, CTNNA1, DICER1, DIS3L2, EGFR, ENG, EPCAM, FH, FLCN, GALNT12, GATA2, GPC3, GREM1, HOXB13, HRAS, KIT, MAX, MEN1, MET, MITF, MLH1, MLH3, MSH2, MSH3, MSH6, MUTYH, NBN, NF1, NF2, NTHL1, PALB2, PDGFRA, PHOX2B, PMS2, POLD1, POLE, POT1, PRKAR1A, PTCH1, PTEN, RAD50, RAD51C, RAD51D, RB1, RECQL4, RET, RNF43, RPS20, RUNX1, SDHA, SDHAF2, SDHB, SDHC, SDHD, SMAD4, SMARCA4, SMARCB1, SMARCE1, STK11, SUFU, TERC, TERT, TMEM127, TP53, TSC1, TSC2, VHL, WRN, WT1  The test report will be scanned into EPIC and  will be located under the Molecular Pathology section of the Results Review tab. A portion of the result report is included below for reference.     We discussed with Ms. Cibrian that because current genetic testing is not perfect, it is possible there may be a gene mutation in one of these genes that current testing cannot detect, but that chance is small.  We also discussed, that there could be another gene that has not yet been discovered, or that we have not yet tested, that is  responsible for the cancer diagnoses in the family. It is also possible there is a hereditary cause for the cancer in the family that Ms. Veith did not inherit and therefore was not identified in her testing.  Therefore, it is important to remain in touch with cancer genetics in the future so that we can continue to offer Ms. Yniguez the most up to date genetic testing.   ADDITIONAL GENETIC TESTING: We discussed with Ms. Stingley that her genetic testing was fairly extensive.  If there are are genes identified to increase cancer risk that can be analyzed in the future, we would be happy to discuss and coordinate this testing at that time.    CANCER SCREENING RECOMMENDATIONS: Ms. Matarese test result is considered negative (normal).  This means that we have not identified a hereditary predisposition to cancer in her at this time.   While reassuring, this does not definitively rule out a hereditary predisposition to cancer. It is still possible that there could be genetic mutations that are undetectable by current technology, or genetic mutations in genes that have not been tested or identified to increase cancer risk.   Therefore, it is recommended she continue to follow the cancer management and screening guidelines provided by her healthcare providers.  Her family history is still relatively suspicious for some hereditary cancer risk, and her management and cancer screening recommendations should be made taking this family history into consideration.   An individual's cancer risk is not determined by genetic test results alone.  Overall cancer risk assessment includes additional factors such as personal medical history, family history, etc.  These should be used to make a personalized plan for cancer prevention and surveillance.    Based on the patient's personal and family history, the statistical model (Tyrer Cusik)   Was used to estimate her risk of developing breast cancer. This estimates her lifetime risk of  developing breast cancer to be approximately 7.5%. This estimation is performed in the setting negative genetic test results.  The patient's lifetime breast cancer risk is a preliminary estimate based on available information using one of several models endorsed by the [place] (ACS). The ACS recommends consideration of breast MRI screening as an adjunct to mammography for patients at high risk (defined as 20% or greater lifetime risk). A more detailed breast cancer risk assessment can be considered, if clinically indicated.      Due to her brother's history of melanoma x2, routine skin exams should also be considered.   RECOMMENDATIONS FOR FAMILY MEMBERS:  Relatives in this family might be at some increased risk of developing cancer, over the general population risk, simply due to the family history of cancer.  We recommended women in this family have a yearly mammogram beginning at age 50, or 85 years younger than the earliest onset of cancer, an annual clinical breast exam, and perform monthly breast self-exams. Women in this family should also have a gynecological exam as recommended by  their primary provider. All family members should have a colonoscopy by age 14 (or as directed by their doctors).  All family members should inform their physicians about the family history of cancer so their doctors can make the most appropriate screening recommendations for them.   It is also possible there is a hereditary cause for the cancer in Ms. Rivenbark family that she did not inherit and therefore was not identified in her.   Therefore, we recommended her maternal relatives also have genetic counseling and testing. Ms. Markel will let us know if we can be of any assistance in coordinating genetic counseling and/or testing for these family members.   FOLLOW-UP: Lastly, we discussed with Ms. Batch that cancer genetics is a rapidly advancing field and it is possible that new genetic tests will be  appropriate for her and/or her family members in the future. We encouraged her to remain in contact with cancer genetics on an annual basis so we can update her personal and family histories and let her know of advances in cancer genetics that may benefit this family.   Our contact number was provided. Ms. Quizon questions were answered to her satisfaction, and she knows she is welcome to call us at anytime with additional questions or concerns.   Ferol Luz, MS, [facility] Certified Genetic Counselor [email or web address]

## 2017-12-27 DIAGNOSIS — M2021 Hallux rigidus, right foot: Secondary | ICD-10-CM | POA: Diagnosis not present

## 2017-12-27 DIAGNOSIS — M25532 Pain in left wrist: Secondary | ICD-10-CM | POA: Diagnosis not present

## 2018-01-01 DIAGNOSIS — L710 Perioral dermatitis: Secondary | ICD-10-CM | POA: Diagnosis not present

## 2018-01-14 DIAGNOSIS — H0100A Unspecified blepharitis right eye, upper and lower eyelids: Secondary | ICD-10-CM | POA: Diagnosis not present

## 2018-01-14 DIAGNOSIS — H0100B Unspecified blepharitis left eye, upper and lower eyelids: Secondary | ICD-10-CM | POA: Diagnosis not present

## 2018-02-14 DIAGNOSIS — H019 Unspecified inflammation of eyelid: Secondary | ICD-10-CM | POA: Diagnosis not present

## 2018-02-14 DIAGNOSIS — Z23 Encounter for immunization: Secondary | ICD-10-CM | POA: Diagnosis not present

## 2018-02-18 DIAGNOSIS — H01111 Allergic dermatitis of right upper eyelid: Secondary | ICD-10-CM | POA: Diagnosis not present

## 2018-03-01 DIAGNOSIS — J3081 Allergic rhinitis due to animal (cat) (dog) hair and dander: Secondary | ICD-10-CM | POA: Diagnosis not present

## 2018-03-01 DIAGNOSIS — J3089 Other allergic rhinitis: Secondary | ICD-10-CM | POA: Diagnosis not present

## 2018-03-01 DIAGNOSIS — J301 Allergic rhinitis due to pollen: Secondary | ICD-10-CM | POA: Diagnosis not present

## 2018-03-05 DIAGNOSIS — M25532 Pain in left wrist: Secondary | ICD-10-CM | POA: Diagnosis not present

## 2018-03-05 DIAGNOSIS — M67432 Ganglion, left wrist: Secondary | ICD-10-CM | POA: Diagnosis not present

## 2018-03-15 DIAGNOSIS — J301 Allergic rhinitis due to pollen: Secondary | ICD-10-CM | POA: Diagnosis not present

## 2018-03-18 DIAGNOSIS — J3089 Other allergic rhinitis: Secondary | ICD-10-CM | POA: Diagnosis not present

## 2018-03-18 DIAGNOSIS — J3081 Allergic rhinitis due to animal (cat) (dog) hair and dander: Secondary | ICD-10-CM | POA: Diagnosis not present

## 2018-03-26 DIAGNOSIS — J301 Allergic rhinitis due to pollen: Secondary | ICD-10-CM | POA: Diagnosis not present

## 2018-03-26 DIAGNOSIS — J3089 Other allergic rhinitis: Secondary | ICD-10-CM | POA: Diagnosis not present

## 2018-03-26 DIAGNOSIS — J3081 Allergic rhinitis due to animal (cat) (dog) hair and dander: Secondary | ICD-10-CM | POA: Diagnosis not present

## 2018-03-29 DIAGNOSIS — J301 Allergic rhinitis due to pollen: Secondary | ICD-10-CM | POA: Diagnosis not present

## 2018-03-29 DIAGNOSIS — J3089 Other allergic rhinitis: Secondary | ICD-10-CM | POA: Diagnosis not present

## 2018-03-29 DIAGNOSIS — J3081 Allergic rhinitis due to animal (cat) (dog) hair and dander: Secondary | ICD-10-CM | POA: Diagnosis not present

## 2018-04-02 DIAGNOSIS — J301 Allergic rhinitis due to pollen: Secondary | ICD-10-CM | POA: Diagnosis not present

## 2018-04-02 DIAGNOSIS — J3081 Allergic rhinitis due to animal (cat) (dog) hair and dander: Secondary | ICD-10-CM | POA: Diagnosis not present

## 2018-04-02 DIAGNOSIS — J3089 Other allergic rhinitis: Secondary | ICD-10-CM | POA: Diagnosis not present

## 2018-04-04 DIAGNOSIS — J301 Allergic rhinitis due to pollen: Secondary | ICD-10-CM | POA: Diagnosis not present

## 2018-04-04 DIAGNOSIS — J3089 Other allergic rhinitis: Secondary | ICD-10-CM | POA: Diagnosis not present

## 2018-04-04 DIAGNOSIS — J3081 Allergic rhinitis due to animal (cat) (dog) hair and dander: Secondary | ICD-10-CM | POA: Diagnosis not present

## 2018-04-09 DIAGNOSIS — J3089 Other allergic rhinitis: Secondary | ICD-10-CM | POA: Diagnosis not present

## 2018-04-09 DIAGNOSIS — J3081 Allergic rhinitis due to animal (cat) (dog) hair and dander: Secondary | ICD-10-CM | POA: Diagnosis not present

## 2018-04-09 DIAGNOSIS — J301 Allergic rhinitis due to pollen: Secondary | ICD-10-CM | POA: Diagnosis not present

## 2018-04-11 DIAGNOSIS — J3081 Allergic rhinitis due to animal (cat) (dog) hair and dander: Secondary | ICD-10-CM | POA: Diagnosis not present

## 2018-04-11 DIAGNOSIS — J301 Allergic rhinitis due to pollen: Secondary | ICD-10-CM | POA: Diagnosis not present

## 2018-04-11 DIAGNOSIS — J3089 Other allergic rhinitis: Secondary | ICD-10-CM | POA: Diagnosis not present

## 2018-04-16 DIAGNOSIS — J301 Allergic rhinitis due to pollen: Secondary | ICD-10-CM | POA: Diagnosis not present

## 2018-04-16 DIAGNOSIS — J3089 Other allergic rhinitis: Secondary | ICD-10-CM | POA: Diagnosis not present

## 2018-04-16 DIAGNOSIS — J3081 Allergic rhinitis due to animal (cat) (dog) hair and dander: Secondary | ICD-10-CM | POA: Diagnosis not present

## 2018-04-19 DIAGNOSIS — J3089 Other allergic rhinitis: Secondary | ICD-10-CM | POA: Diagnosis not present

## 2018-04-19 DIAGNOSIS — J3081 Allergic rhinitis due to animal (cat) (dog) hair and dander: Secondary | ICD-10-CM | POA: Diagnosis not present

## 2018-04-19 DIAGNOSIS — J301 Allergic rhinitis due to pollen: Secondary | ICD-10-CM | POA: Diagnosis not present

## 2018-04-25 DIAGNOSIS — L821 Other seborrheic keratosis: Secondary | ICD-10-CM | POA: Diagnosis not present

## 2018-04-25 DIAGNOSIS — L718 Other rosacea: Secondary | ICD-10-CM | POA: Diagnosis not present

## 2018-04-25 DIAGNOSIS — J3081 Allergic rhinitis due to animal (cat) (dog) hair and dander: Secondary | ICD-10-CM | POA: Diagnosis not present

## 2018-04-25 DIAGNOSIS — J301 Allergic rhinitis due to pollen: Secondary | ICD-10-CM | POA: Diagnosis not present

## 2018-04-25 DIAGNOSIS — J3089 Other allergic rhinitis: Secondary | ICD-10-CM | POA: Diagnosis not present

## 2018-04-25 DIAGNOSIS — D2272 Melanocytic nevi of left lower limb, including hip: Secondary | ICD-10-CM | POA: Diagnosis not present

## 2018-04-25 DIAGNOSIS — L570 Actinic keratosis: Secondary | ICD-10-CM | POA: Diagnosis not present

## 2018-04-30 DIAGNOSIS — J3081 Allergic rhinitis due to animal (cat) (dog) hair and dander: Secondary | ICD-10-CM | POA: Diagnosis not present

## 2018-04-30 DIAGNOSIS — J3089 Other allergic rhinitis: Secondary | ICD-10-CM | POA: Diagnosis not present

## 2018-04-30 DIAGNOSIS — J301 Allergic rhinitis due to pollen: Secondary | ICD-10-CM | POA: Diagnosis not present

## 2018-05-02 DIAGNOSIS — J3089 Other allergic rhinitis: Secondary | ICD-10-CM | POA: Diagnosis not present

## 2018-05-02 DIAGNOSIS — J301 Allergic rhinitis due to pollen: Secondary | ICD-10-CM | POA: Diagnosis not present

## 2018-05-02 DIAGNOSIS — J3081 Allergic rhinitis due to animal (cat) (dog) hair and dander: Secondary | ICD-10-CM | POA: Diagnosis not present

## 2018-05-08 DIAGNOSIS — J3081 Allergic rhinitis due to animal (cat) (dog) hair and dander: Secondary | ICD-10-CM | POA: Diagnosis not present

## 2018-05-08 DIAGNOSIS — J3089 Other allergic rhinitis: Secondary | ICD-10-CM | POA: Diagnosis not present

## 2018-05-08 DIAGNOSIS — J301 Allergic rhinitis due to pollen: Secondary | ICD-10-CM | POA: Diagnosis not present

## 2018-05-10 DIAGNOSIS — J301 Allergic rhinitis due to pollen: Secondary | ICD-10-CM | POA: Diagnosis not present

## 2018-05-10 DIAGNOSIS — J3081 Allergic rhinitis due to animal (cat) (dog) hair and dander: Secondary | ICD-10-CM | POA: Diagnosis not present

## 2018-05-10 DIAGNOSIS — J3089 Other allergic rhinitis: Secondary | ICD-10-CM | POA: Diagnosis not present

## 2018-05-17 DIAGNOSIS — J3089 Other allergic rhinitis: Secondary | ICD-10-CM | POA: Diagnosis not present

## 2018-05-17 DIAGNOSIS — J3081 Allergic rhinitis due to animal (cat) (dog) hair and dander: Secondary | ICD-10-CM | POA: Diagnosis not present

## 2018-05-17 DIAGNOSIS — J301 Allergic rhinitis due to pollen: Secondary | ICD-10-CM | POA: Diagnosis not present

## 2018-05-22 DIAGNOSIS — J3081 Allergic rhinitis due to animal (cat) (dog) hair and dander: Secondary | ICD-10-CM | POA: Diagnosis not present

## 2018-05-22 DIAGNOSIS — J3089 Other allergic rhinitis: Secondary | ICD-10-CM | POA: Diagnosis not present

## 2018-05-22 DIAGNOSIS — J301 Allergic rhinitis due to pollen: Secondary | ICD-10-CM | POA: Diagnosis not present

## 2018-05-24 DIAGNOSIS — J301 Allergic rhinitis due to pollen: Secondary | ICD-10-CM | POA: Diagnosis not present

## 2018-05-24 DIAGNOSIS — J3081 Allergic rhinitis due to animal (cat) (dog) hair and dander: Secondary | ICD-10-CM | POA: Diagnosis not present

## 2018-05-24 DIAGNOSIS — J3089 Other allergic rhinitis: Secondary | ICD-10-CM | POA: Diagnosis not present

## 2018-05-27 DIAGNOSIS — J301 Allergic rhinitis due to pollen: Secondary | ICD-10-CM | POA: Diagnosis not present

## 2018-05-27 DIAGNOSIS — J3089 Other allergic rhinitis: Secondary | ICD-10-CM | POA: Diagnosis not present

## 2018-05-27 DIAGNOSIS — J3081 Allergic rhinitis due to animal (cat) (dog) hair and dander: Secondary | ICD-10-CM | POA: Diagnosis not present

## 2018-05-29 DIAGNOSIS — J301 Allergic rhinitis due to pollen: Secondary | ICD-10-CM | POA: Diagnosis not present

## 2018-05-29 DIAGNOSIS — J3089 Other allergic rhinitis: Secondary | ICD-10-CM | POA: Diagnosis not present

## 2018-05-29 DIAGNOSIS — J3081 Allergic rhinitis due to animal (cat) (dog) hair and dander: Secondary | ICD-10-CM | POA: Diagnosis not present

## 2018-06-05 DIAGNOSIS — J3081 Allergic rhinitis due to animal (cat) (dog) hair and dander: Secondary | ICD-10-CM | POA: Diagnosis not present

## 2018-06-05 DIAGNOSIS — J3089 Other allergic rhinitis: Secondary | ICD-10-CM | POA: Diagnosis not present

## 2018-06-05 DIAGNOSIS — J301 Allergic rhinitis due to pollen: Secondary | ICD-10-CM | POA: Diagnosis not present

## 2018-06-11 DIAGNOSIS — J3089 Other allergic rhinitis: Secondary | ICD-10-CM | POA: Diagnosis not present

## 2018-06-11 DIAGNOSIS — J3081 Allergic rhinitis due to animal (cat) (dog) hair and dander: Secondary | ICD-10-CM | POA: Diagnosis not present

## 2018-06-11 DIAGNOSIS — J301 Allergic rhinitis due to pollen: Secondary | ICD-10-CM | POA: Diagnosis not present

## 2018-06-13 DIAGNOSIS — J3089 Other allergic rhinitis: Secondary | ICD-10-CM | POA: Diagnosis not present

## 2018-06-13 DIAGNOSIS — J301 Allergic rhinitis due to pollen: Secondary | ICD-10-CM | POA: Diagnosis not present

## 2018-06-13 DIAGNOSIS — J3081 Allergic rhinitis due to animal (cat) (dog) hair and dander: Secondary | ICD-10-CM | POA: Diagnosis not present

## 2018-06-20 DIAGNOSIS — J3089 Other allergic rhinitis: Secondary | ICD-10-CM | POA: Diagnosis not present

## 2018-06-20 DIAGNOSIS — J301 Allergic rhinitis due to pollen: Secondary | ICD-10-CM | POA: Diagnosis not present

## 2018-06-20 DIAGNOSIS — J3081 Allergic rhinitis due to animal (cat) (dog) hair and dander: Secondary | ICD-10-CM | POA: Diagnosis not present

## 2018-06-21 DIAGNOSIS — E039 Hypothyroidism, unspecified: Secondary | ICD-10-CM | POA: Diagnosis not present

## 2018-06-21 DIAGNOSIS — H1010 Acute atopic conjunctivitis, unspecified eye: Secondary | ICD-10-CM | POA: Diagnosis not present

## 2018-06-21 DIAGNOSIS — E559 Vitamin D deficiency, unspecified: Secondary | ICD-10-CM | POA: Diagnosis not present

## 2018-06-21 DIAGNOSIS — I10 Essential (primary) hypertension: Secondary | ICD-10-CM | POA: Diagnosis not present

## 2018-06-25 DIAGNOSIS — L570 Actinic keratosis: Secondary | ICD-10-CM | POA: Diagnosis not present

## 2018-06-26 DIAGNOSIS — I10 Essential (primary) hypertension: Secondary | ICD-10-CM | POA: Diagnosis not present

## 2018-06-26 DIAGNOSIS — E039 Hypothyroidism, unspecified: Secondary | ICD-10-CM | POA: Diagnosis not present

## 2018-06-26 DIAGNOSIS — E559 Vitamin D deficiency, unspecified: Secondary | ICD-10-CM | POA: Diagnosis not present

## 2018-06-26 DIAGNOSIS — J301 Allergic rhinitis due to pollen: Secondary | ICD-10-CM | POA: Diagnosis not present

## 2018-06-26 DIAGNOSIS — J3089 Other allergic rhinitis: Secondary | ICD-10-CM | POA: Diagnosis not present

## 2018-06-26 DIAGNOSIS — J3081 Allergic rhinitis due to animal (cat) (dog) hair and dander: Secondary | ICD-10-CM | POA: Diagnosis not present

## 2018-06-26 DIAGNOSIS — R7989 Other specified abnormal findings of blood chemistry: Secondary | ICD-10-CM | POA: Diagnosis not present

## 2018-07-03 DIAGNOSIS — J3089 Other allergic rhinitis: Secondary | ICD-10-CM | POA: Diagnosis not present

## 2018-07-03 DIAGNOSIS — J3081 Allergic rhinitis due to animal (cat) (dog) hair and dander: Secondary | ICD-10-CM | POA: Diagnosis not present

## 2018-07-03 DIAGNOSIS — J301 Allergic rhinitis due to pollen: Secondary | ICD-10-CM | POA: Diagnosis not present

## 2018-07-09 DIAGNOSIS — J301 Allergic rhinitis due to pollen: Secondary | ICD-10-CM | POA: Diagnosis not present

## 2018-07-09 DIAGNOSIS — J3081 Allergic rhinitis due to animal (cat) (dog) hair and dander: Secondary | ICD-10-CM | POA: Diagnosis not present

## 2018-07-09 DIAGNOSIS — J3089 Other allergic rhinitis: Secondary | ICD-10-CM | POA: Diagnosis not present

## 2018-07-16 DIAGNOSIS — J301 Allergic rhinitis due to pollen: Secondary | ICD-10-CM | POA: Diagnosis not present

## 2018-07-16 DIAGNOSIS — J3089 Other allergic rhinitis: Secondary | ICD-10-CM | POA: Diagnosis not present

## 2018-07-16 DIAGNOSIS — J3081 Allergic rhinitis due to animal (cat) (dog) hair and dander: Secondary | ICD-10-CM | POA: Diagnosis not present

## 2018-07-17 DIAGNOSIS — J301 Allergic rhinitis due to pollen: Secondary | ICD-10-CM | POA: Diagnosis not present

## 2018-07-18 DIAGNOSIS — J3081 Allergic rhinitis due to animal (cat) (dog) hair and dander: Secondary | ICD-10-CM | POA: Diagnosis not present

## 2018-07-18 DIAGNOSIS — J3089 Other allergic rhinitis: Secondary | ICD-10-CM | POA: Diagnosis not present

## 2018-07-26 DIAGNOSIS — J301 Allergic rhinitis due to pollen: Secondary | ICD-10-CM | POA: Diagnosis not present

## 2018-07-26 DIAGNOSIS — J3081 Allergic rhinitis due to animal (cat) (dog) hair and dander: Secondary | ICD-10-CM | POA: Diagnosis not present

## 2018-07-26 DIAGNOSIS — J3089 Other allergic rhinitis: Secondary | ICD-10-CM | POA: Diagnosis not present

## 2018-08-05 DIAGNOSIS — J301 Allergic rhinitis due to pollen: Secondary | ICD-10-CM | POA: Diagnosis not present

## 2018-08-05 DIAGNOSIS — J3081 Allergic rhinitis due to animal (cat) (dog) hair and dander: Secondary | ICD-10-CM | POA: Diagnosis not present

## 2018-08-05 DIAGNOSIS — J3089 Other allergic rhinitis: Secondary | ICD-10-CM | POA: Diagnosis not present

## 2018-08-08 DIAGNOSIS — J3081 Allergic rhinitis due to animal (cat) (dog) hair and dander: Secondary | ICD-10-CM | POA: Diagnosis not present

## 2018-08-08 DIAGNOSIS — J301 Allergic rhinitis due to pollen: Secondary | ICD-10-CM | POA: Diagnosis not present

## 2018-08-08 DIAGNOSIS — J3089 Other allergic rhinitis: Secondary | ICD-10-CM | POA: Diagnosis not present

## 2018-08-13 DIAGNOSIS — J301 Allergic rhinitis due to pollen: Secondary | ICD-10-CM | POA: Diagnosis not present

## 2018-08-13 DIAGNOSIS — J3081 Allergic rhinitis due to animal (cat) (dog) hair and dander: Secondary | ICD-10-CM | POA: Diagnosis not present

## 2018-08-13 DIAGNOSIS — J3089 Other allergic rhinitis: Secondary | ICD-10-CM | POA: Diagnosis not present

## 2018-08-16 DIAGNOSIS — J3081 Allergic rhinitis due to animal (cat) (dog) hair and dander: Secondary | ICD-10-CM | POA: Diagnosis not present

## 2018-08-16 DIAGNOSIS — J3089 Other allergic rhinitis: Secondary | ICD-10-CM | POA: Diagnosis not present

## 2018-08-16 DIAGNOSIS — J301 Allergic rhinitis due to pollen: Secondary | ICD-10-CM | POA: Diagnosis not present

## 2018-08-20 DIAGNOSIS — J301 Allergic rhinitis due to pollen: Secondary | ICD-10-CM | POA: Diagnosis not present

## 2018-08-20 DIAGNOSIS — J3089 Other allergic rhinitis: Secondary | ICD-10-CM | POA: Diagnosis not present

## 2018-08-20 DIAGNOSIS — J3081 Allergic rhinitis due to animal (cat) (dog) hair and dander: Secondary | ICD-10-CM | POA: Diagnosis not present

## 2018-08-27 DIAGNOSIS — J3081 Allergic rhinitis due to animal (cat) (dog) hair and dander: Secondary | ICD-10-CM | POA: Diagnosis not present

## 2018-08-27 DIAGNOSIS — J3089 Other allergic rhinitis: Secondary | ICD-10-CM | POA: Diagnosis not present

## 2018-08-27 DIAGNOSIS — J301 Allergic rhinitis due to pollen: Secondary | ICD-10-CM | POA: Diagnosis not present

## 2018-09-06 DIAGNOSIS — J3081 Allergic rhinitis due to animal (cat) (dog) hair and dander: Secondary | ICD-10-CM | POA: Diagnosis not present

## 2018-09-06 DIAGNOSIS — J301 Allergic rhinitis due to pollen: Secondary | ICD-10-CM | POA: Diagnosis not present

## 2018-09-06 DIAGNOSIS — J3089 Other allergic rhinitis: Secondary | ICD-10-CM | POA: Diagnosis not present

## 2018-09-10 DIAGNOSIS — J3081 Allergic rhinitis due to animal (cat) (dog) hair and dander: Secondary | ICD-10-CM | POA: Diagnosis not present

## 2018-09-10 DIAGNOSIS — J301 Allergic rhinitis due to pollen: Secondary | ICD-10-CM | POA: Diagnosis not present

## 2018-09-10 DIAGNOSIS — J3089 Other allergic rhinitis: Secondary | ICD-10-CM | POA: Diagnosis not present

## 2018-09-16 DIAGNOSIS — J3081 Allergic rhinitis due to animal (cat) (dog) hair and dander: Secondary | ICD-10-CM | POA: Diagnosis not present

## 2018-09-16 DIAGNOSIS — J3089 Other allergic rhinitis: Secondary | ICD-10-CM | POA: Diagnosis not present

## 2018-09-16 DIAGNOSIS — J301 Allergic rhinitis due to pollen: Secondary | ICD-10-CM | POA: Diagnosis not present

## 2018-09-17 DIAGNOSIS — Z3040 Encounter for surveillance of contraceptives, unspecified: Secondary | ICD-10-CM | POA: Diagnosis not present

## 2018-09-17 DIAGNOSIS — Z01419 Encounter for gynecological examination (general) (routine) without abnormal findings: Secondary | ICD-10-CM | POA: Diagnosis not present

## 2018-09-17 DIAGNOSIS — Z1211 Encounter for screening for malignant neoplasm of colon: Secondary | ICD-10-CM | POA: Diagnosis not present

## 2018-09-17 DIAGNOSIS — Z1231 Encounter for screening mammogram for malignant neoplasm of breast: Secondary | ICD-10-CM | POA: Diagnosis not present

## 2018-09-17 DIAGNOSIS — Z8041 Family history of malignant neoplasm of ovary: Secondary | ICD-10-CM | POA: Diagnosis not present

## 2018-09-20 DIAGNOSIS — E559 Vitamin D deficiency, unspecified: Secondary | ICD-10-CM | POA: Diagnosis not present

## 2018-09-20 DIAGNOSIS — R809 Proteinuria, unspecified: Secondary | ICD-10-CM | POA: Diagnosis not present

## 2018-09-20 DIAGNOSIS — E039 Hypothyroidism, unspecified: Secondary | ICD-10-CM | POA: Diagnosis not present

## 2018-09-30 DIAGNOSIS — J301 Allergic rhinitis due to pollen: Secondary | ICD-10-CM | POA: Diagnosis not present

## 2018-09-30 DIAGNOSIS — J3089 Other allergic rhinitis: Secondary | ICD-10-CM | POA: Diagnosis not present

## 2018-09-30 DIAGNOSIS — J3081 Allergic rhinitis due to animal (cat) (dog) hair and dander: Secondary | ICD-10-CM | POA: Diagnosis not present

## 2018-10-11 DIAGNOSIS — J3089 Other allergic rhinitis: Secondary | ICD-10-CM | POA: Diagnosis not present

## 2018-10-11 DIAGNOSIS — E8941 Symptomatic postprocedural ovarian failure: Secondary | ICD-10-CM | POA: Diagnosis not present

## 2018-10-11 DIAGNOSIS — J301 Allergic rhinitis due to pollen: Secondary | ICD-10-CM | POA: Diagnosis not present

## 2018-10-11 DIAGNOSIS — J3081 Allergic rhinitis due to animal (cat) (dog) hair and dander: Secondary | ICD-10-CM | POA: Diagnosis not present

## 2018-10-11 DIAGNOSIS — Z1231 Encounter for screening mammogram for malignant neoplasm of breast: Secondary | ICD-10-CM | POA: Diagnosis not present

## 2018-10-25 DIAGNOSIS — J3089 Other allergic rhinitis: Secondary | ICD-10-CM | POA: Diagnosis not present

## 2018-10-25 DIAGNOSIS — J3081 Allergic rhinitis due to animal (cat) (dog) hair and dander: Secondary | ICD-10-CM | POA: Diagnosis not present

## 2018-10-25 DIAGNOSIS — J301 Allergic rhinitis due to pollen: Secondary | ICD-10-CM | POA: Diagnosis not present

## 2018-11-01 DIAGNOSIS — J3081 Allergic rhinitis due to animal (cat) (dog) hair and dander: Secondary | ICD-10-CM | POA: Diagnosis not present

## 2018-11-01 DIAGNOSIS — J3089 Other allergic rhinitis: Secondary | ICD-10-CM | POA: Diagnosis not present

## 2018-11-01 DIAGNOSIS — J301 Allergic rhinitis due to pollen: Secondary | ICD-10-CM | POA: Diagnosis not present

## 2018-11-11 DIAGNOSIS — J3081 Allergic rhinitis due to animal (cat) (dog) hair and dander: Secondary | ICD-10-CM | POA: Diagnosis not present

## 2018-11-11 DIAGNOSIS — J301 Allergic rhinitis due to pollen: Secondary | ICD-10-CM | POA: Diagnosis not present

## 2018-11-11 DIAGNOSIS — J3089 Other allergic rhinitis: Secondary | ICD-10-CM | POA: Diagnosis not present

## 2018-11-20 DIAGNOSIS — J3089 Other allergic rhinitis: Secondary | ICD-10-CM | POA: Diagnosis not present

## 2018-11-20 DIAGNOSIS — J3081 Allergic rhinitis due to animal (cat) (dog) hair and dander: Secondary | ICD-10-CM | POA: Diagnosis not present

## 2018-11-20 DIAGNOSIS — J301 Allergic rhinitis due to pollen: Secondary | ICD-10-CM | POA: Diagnosis not present

## 2018-12-05 DIAGNOSIS — J3081 Allergic rhinitis due to animal (cat) (dog) hair and dander: Secondary | ICD-10-CM | POA: Diagnosis not present

## 2018-12-05 DIAGNOSIS — J301 Allergic rhinitis due to pollen: Secondary | ICD-10-CM | POA: Diagnosis not present

## 2018-12-05 DIAGNOSIS — J3089 Other allergic rhinitis: Secondary | ICD-10-CM | POA: Diagnosis not present

## 2018-12-08 DIAGNOSIS — Z20828 Contact with and (suspected) exposure to other viral communicable diseases: Secondary | ICD-10-CM | POA: Diagnosis not present

## 2018-12-08 DIAGNOSIS — Z01812 Encounter for preprocedural laboratory examination: Secondary | ICD-10-CM | POA: Diagnosis not present

## 2018-12-08 DIAGNOSIS — B349 Viral infection, unspecified: Secondary | ICD-10-CM | POA: Diagnosis not present

## 2018-12-10 DIAGNOSIS — M659 Synovitis and tenosynovitis, unspecified: Secondary | ICD-10-CM | POA: Diagnosis not present

## 2018-12-10 DIAGNOSIS — M67432 Ganglion, left wrist: Secondary | ICD-10-CM | POA: Diagnosis not present

## 2018-12-12 DIAGNOSIS — J301 Allergic rhinitis due to pollen: Secondary | ICD-10-CM | POA: Diagnosis not present

## 2018-12-12 DIAGNOSIS — J3081 Allergic rhinitis due to animal (cat) (dog) hair and dander: Secondary | ICD-10-CM | POA: Diagnosis not present

## 2018-12-12 DIAGNOSIS — J3089 Other allergic rhinitis: Secondary | ICD-10-CM | POA: Diagnosis not present

## 2018-12-25 DIAGNOSIS — J301 Allergic rhinitis due to pollen: Secondary | ICD-10-CM | POA: Diagnosis not present

## 2018-12-25 DIAGNOSIS — J3089 Other allergic rhinitis: Secondary | ICD-10-CM | POA: Diagnosis not present

## 2018-12-25 DIAGNOSIS — J3081 Allergic rhinitis due to animal (cat) (dog) hair and dander: Secondary | ICD-10-CM | POA: Diagnosis not present

## 2019-01-02 DIAGNOSIS — J3081 Allergic rhinitis due to animal (cat) (dog) hair and dander: Secondary | ICD-10-CM | POA: Diagnosis not present

## 2019-01-02 DIAGNOSIS — J3089 Other allergic rhinitis: Secondary | ICD-10-CM | POA: Diagnosis not present

## 2019-01-02 DIAGNOSIS — J301 Allergic rhinitis due to pollen: Secondary | ICD-10-CM | POA: Diagnosis not present

## 2019-01-10 DIAGNOSIS — J3081 Allergic rhinitis due to animal (cat) (dog) hair and dander: Secondary | ICD-10-CM | POA: Diagnosis not present

## 2019-01-10 DIAGNOSIS — J301 Allergic rhinitis due to pollen: Secondary | ICD-10-CM | POA: Diagnosis not present

## 2019-01-10 DIAGNOSIS — J3089 Other allergic rhinitis: Secondary | ICD-10-CM | POA: Diagnosis not present

## 2019-01-14 DIAGNOSIS — J301 Allergic rhinitis due to pollen: Secondary | ICD-10-CM | POA: Diagnosis not present

## 2019-01-15 DIAGNOSIS — J3089 Other allergic rhinitis: Secondary | ICD-10-CM | POA: Diagnosis not present

## 2019-01-15 DIAGNOSIS — J3081 Allergic rhinitis due to animal (cat) (dog) hair and dander: Secondary | ICD-10-CM | POA: Diagnosis not present

## 2019-01-27 DIAGNOSIS — J3081 Allergic rhinitis due to animal (cat) (dog) hair and dander: Secondary | ICD-10-CM | POA: Diagnosis not present

## 2019-01-27 DIAGNOSIS — J301 Allergic rhinitis due to pollen: Secondary | ICD-10-CM | POA: Diagnosis not present

## 2019-01-27 DIAGNOSIS — J3089 Other allergic rhinitis: Secondary | ICD-10-CM | POA: Diagnosis not present

## 2019-01-29 DIAGNOSIS — E8941 Symptomatic postprocedural ovarian failure: Secondary | ICD-10-CM | POA: Diagnosis not present

## 2019-02-03 DIAGNOSIS — J301 Allergic rhinitis due to pollen: Secondary | ICD-10-CM | POA: Diagnosis not present

## 2019-02-03 DIAGNOSIS — J3089 Other allergic rhinitis: Secondary | ICD-10-CM | POA: Diagnosis not present

## 2019-02-03 DIAGNOSIS — J3081 Allergic rhinitis due to animal (cat) (dog) hair and dander: Secondary | ICD-10-CM | POA: Diagnosis not present

## 2019-02-10 DIAGNOSIS — J301 Allergic rhinitis due to pollen: Secondary | ICD-10-CM | POA: Diagnosis not present

## 2019-02-10 DIAGNOSIS — J3081 Allergic rhinitis due to animal (cat) (dog) hair and dander: Secondary | ICD-10-CM | POA: Diagnosis not present

## 2019-02-10 DIAGNOSIS — J3089 Other allergic rhinitis: Secondary | ICD-10-CM | POA: Diagnosis not present

## 2019-03-03 DIAGNOSIS — J3081 Allergic rhinitis due to animal (cat) (dog) hair and dander: Secondary | ICD-10-CM | POA: Diagnosis not present

## 2019-03-03 DIAGNOSIS — J3089 Other allergic rhinitis: Secondary | ICD-10-CM | POA: Diagnosis not present

## 2019-03-03 DIAGNOSIS — J301 Allergic rhinitis due to pollen: Secondary | ICD-10-CM | POA: Diagnosis not present

## 2019-03-10 DIAGNOSIS — J3089 Other allergic rhinitis: Secondary | ICD-10-CM | POA: Diagnosis not present

## 2019-03-10 DIAGNOSIS — J3081 Allergic rhinitis due to animal (cat) (dog) hair and dander: Secondary | ICD-10-CM | POA: Diagnosis not present

## 2019-03-10 DIAGNOSIS — J301 Allergic rhinitis due to pollen: Secondary | ICD-10-CM | POA: Diagnosis not present

## 2019-03-18 DIAGNOSIS — J3089 Other allergic rhinitis: Secondary | ICD-10-CM | POA: Diagnosis not present

## 2019-03-18 DIAGNOSIS — J301 Allergic rhinitis due to pollen: Secondary | ICD-10-CM | POA: Diagnosis not present

## 2019-03-18 DIAGNOSIS — J3081 Allergic rhinitis due to animal (cat) (dog) hair and dander: Secondary | ICD-10-CM | POA: Diagnosis not present

## 2019-03-24 DIAGNOSIS — J3081 Allergic rhinitis due to animal (cat) (dog) hair and dander: Secondary | ICD-10-CM | POA: Diagnosis not present

## 2019-03-24 DIAGNOSIS — J301 Allergic rhinitis due to pollen: Secondary | ICD-10-CM | POA: Diagnosis not present

## 2019-03-24 DIAGNOSIS — J3089 Other allergic rhinitis: Secondary | ICD-10-CM | POA: Diagnosis not present

## 2019-03-26 DIAGNOSIS — I10 Essential (primary) hypertension: Secondary | ICD-10-CM | POA: Diagnosis not present

## 2019-03-26 DIAGNOSIS — E039 Hypothyroidism, unspecified: Secondary | ICD-10-CM | POA: Diagnosis not present

## 2019-03-26 DIAGNOSIS — J3081 Allergic rhinitis due to animal (cat) (dog) hair and dander: Secondary | ICD-10-CM | POA: Diagnosis not present

## 2019-03-26 DIAGNOSIS — E559 Vitamin D deficiency, unspecified: Secondary | ICD-10-CM | POA: Diagnosis not present

## 2019-03-26 DIAGNOSIS — J301 Allergic rhinitis due to pollen: Secondary | ICD-10-CM | POA: Diagnosis not present

## 2019-03-26 DIAGNOSIS — J3089 Other allergic rhinitis: Secondary | ICD-10-CM | POA: Diagnosis not present

## 2019-03-28 DIAGNOSIS — I10 Essential (primary) hypertension: Secondary | ICD-10-CM | POA: Diagnosis not present

## 2019-03-28 DIAGNOSIS — E039 Hypothyroidism, unspecified: Secondary | ICD-10-CM | POA: Diagnosis not present

## 2019-03-31 DIAGNOSIS — J3081 Allergic rhinitis due to animal (cat) (dog) hair and dander: Secondary | ICD-10-CM | POA: Diagnosis not present

## 2019-03-31 DIAGNOSIS — J3089 Other allergic rhinitis: Secondary | ICD-10-CM | POA: Diagnosis not present

## 2019-03-31 DIAGNOSIS — J301 Allergic rhinitis due to pollen: Secondary | ICD-10-CM | POA: Diagnosis not present

## 2019-04-10 DIAGNOSIS — J301 Allergic rhinitis due to pollen: Secondary | ICD-10-CM | POA: Diagnosis not present

## 2019-04-10 DIAGNOSIS — J3081 Allergic rhinitis due to animal (cat) (dog) hair and dander: Secondary | ICD-10-CM | POA: Diagnosis not present

## 2019-04-10 DIAGNOSIS — J3089 Other allergic rhinitis: Secondary | ICD-10-CM | POA: Diagnosis not present

## 2019-04-18 DIAGNOSIS — J3089 Other allergic rhinitis: Secondary | ICD-10-CM | POA: Diagnosis not present

## 2019-04-18 DIAGNOSIS — J301 Allergic rhinitis due to pollen: Secondary | ICD-10-CM | POA: Diagnosis not present

## 2019-04-18 DIAGNOSIS — J3081 Allergic rhinitis due to animal (cat) (dog) hair and dander: Secondary | ICD-10-CM | POA: Diagnosis not present

## 2019-04-25 DIAGNOSIS — J3081 Allergic rhinitis due to animal (cat) (dog) hair and dander: Secondary | ICD-10-CM | POA: Diagnosis not present

## 2019-04-25 DIAGNOSIS — J301 Allergic rhinitis due to pollen: Secondary | ICD-10-CM | POA: Diagnosis not present

## 2019-04-25 DIAGNOSIS — J3089 Other allergic rhinitis: Secondary | ICD-10-CM | POA: Diagnosis not present

## 2019-04-30 DIAGNOSIS — N644 Mastodynia: Secondary | ICD-10-CM | POA: Diagnosis not present

## 2019-04-30 DIAGNOSIS — N6320 Unspecified lump in the left breast, unspecified quadrant: Secondary | ICD-10-CM | POA: Diagnosis not present

## 2019-04-30 DIAGNOSIS — Z8041 Family history of malignant neoplasm of ovary: Secondary | ICD-10-CM | POA: Diagnosis not present

## 2019-05-05 DIAGNOSIS — J3081 Allergic rhinitis due to animal (cat) (dog) hair and dander: Secondary | ICD-10-CM | POA: Diagnosis not present

## 2019-05-05 DIAGNOSIS — J3089 Other allergic rhinitis: Secondary | ICD-10-CM | POA: Diagnosis not present

## 2019-05-05 DIAGNOSIS — J301 Allergic rhinitis due to pollen: Secondary | ICD-10-CM | POA: Diagnosis not present

## 2019-05-09 DIAGNOSIS — H5203 Hypermetropia, bilateral: Secondary | ICD-10-CM | POA: Diagnosis not present

## 2019-05-09 DIAGNOSIS — H524 Presbyopia: Secondary | ICD-10-CM | POA: Diagnosis not present

## 2019-05-09 DIAGNOSIS — H10413 Chronic giant papillary conjunctivitis, bilateral: Secondary | ICD-10-CM | POA: Diagnosis not present

## 2019-05-16 ENCOUNTER — Ambulatory Visit: Payer: Self-pay | Attending: Internal Medicine

## 2019-05-16 DIAGNOSIS — Z23 Encounter for immunization: Secondary | ICD-10-CM

## 2019-05-16 NOTE — Progress Notes (Signed)
   Covid-19 Vaccination Clinic  Name:  FATUMA DOWERS    MRN: [id number] DOB: 1965/03/30  05/16/2019  Ms. Schamberger was observed post Covid-19 immunization for 15 minutes without incident. She was provided with Vaccine Information Sheet and instruction to access the V-Safe system.   Ms. Ritacco was instructed to call 911 with any severe reactions post vaccine: Marland Kitchen Difficulty breathing  . Swelling of face and throat  . A fast heartbeat  . A bad rash all over body  . Dizziness and weakness   Immunizations Administered    Name Date Dose VIS Date Route   Pfizer COVID-19 Vaccine 05/16/2019  8:34 AM 0.3 mL 02/07/2019 Intramuscular   Manufacturer: [employer], [employer]   Lot: [id number]   NDC: [address]-6

## 2019-05-20 DIAGNOSIS — R0789 Other chest pain: Secondary | ICD-10-CM | POA: Diagnosis not present

## 2019-05-20 DIAGNOSIS — N6320 Unspecified lump in the left breast, unspecified quadrant: Secondary | ICD-10-CM | POA: Diagnosis not present

## 2019-05-20 DIAGNOSIS — N63 Unspecified lump in unspecified breast: Secondary | ICD-10-CM | POA: Diagnosis not present

## 2019-06-11 ENCOUNTER — Ambulatory Visit: Payer: Self-pay | Attending: Internal Medicine

## 2019-06-11 DIAGNOSIS — Z23 Encounter for immunization: Secondary | ICD-10-CM

## 2019-06-11 NOTE — Progress Notes (Signed)
   Covid-19 Vaccination Clinic  Name:  Renee Keith    MRN: [id number] DOB: 03/21/1965  06/11/2019  [name] was observed post Covid-19 immunization for 15 minutes without incident. She was provided with Vaccine Information Sheet and instruction to access the V-Safe system.   [name] was instructed to call 911 with any severe reactions post vaccine: Marland Kitchen Difficulty breathing  . Swelling of face and throat  . A fast heartbeat  . A bad rash all over body  . Dizziness and weakness   Immunizations Administered    Name Date Dose VIS Date Route   Pfizer COVID-19 Vaccine 06/11/2019  8:20 AM 0.3 mL 02/07/2019 Intramuscular   Manufacturer: [employer], [employer]   Lot: [id number]   NDC: [address]-0

## 2019-07-31 ENCOUNTER — Other Ambulatory Visit: Payer: Self-pay | Admitting: Obstetrics and Gynecology

## 2019-10-29 NOTE — H&P (Signed)
Renee Keith is a 54 y.o. female, P: 3-0-1-3, presents for prophylactic bilateral salpingo-oophorectomy because of a first degree relative with ovarian cancer. The patient has no pelvic pain, changes in bowel or bladder function, dyspareunia or vaginitis symptoms. Over the past year the patient has only had #2 menstrual periods that lasted for 1 day and only required a single pad for that day. She denies any associated cramping. Though the patient's hereditary cancer screening test was negative she is at increased risk for ovarian cancer and is undergoing prophylactic bilateral salpingo-oophorectomy.  Past Medical History  OB History: G: 4;  P: 3-0-1-3: SVB: 2001, 2002 and 2003  GYN History: menarche:54 YO;   LMP: 03/15/2019  Contraception: none;   Denies history of abnormal PAP smear.   Last PAP smear: 2018  Medical History: Hypothyroidism, Hypertension, Left Wrist Ganglion  and Negative for Hereditary Cancer Mutations  Surgical History: 2010 Breast Implants; 2014 Removal of Breast Implants; 2018 Right Foot Surgery and  2020 Repair of Left Wrist Ganglion Denies problems with anesthesia except for post operative nausea.  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 Penicillins Shortness Of Breath    As childs    Denies sensitivity to peanuts, shellfish, soy, latex or adhesives.  ROS: Admits to glasses and left index finger pain but  denies headache, vision changes, nasal congestion, dysphagia, tinnitus, dizziness, hoarseness, cough,  chest pain, shortness of breath, nausea, vomiting, diarrhea,constipation,  urinary frequency, urgency  dysuria, hematuria, vaginitis symptoms,  pelvic pain, swelling of joints,easy bruising,  myalgias, arthralgias, skin rashes, unexplained weight loss and except as is mentioned in the history of present illness, patient's review of systems is otherwise negative.   Physical Exam  Bp: 128/82;  P: 83 bpm; R: 16;  Temperature: 96.8 degrees F temporally; Weight: 138 lbs.  Height: 5' 5.5 ";  BMI: 22.6  Neck: supple without masses or thyromegaly Lungs: clear to auscultation Heart: regular rate and rhythm Abdomen: soft, non-tender and no organomegaly Pelvic:EGBUS- wnl; vagina-normal rugae; uterus-normal size, cervix without lesions or motion tenderness; adnexae-no tenderness or masses Extremities:  no clubbing, cyanosis or edema   Assesment:  History of First Degree Relative Ovarian Cancer   Disposition:  A discussion was held with the patient regarding the indication for her procedure along with the risk and benefits to include but not limited to: the robotic approach has less postoperative pain, less blood loss during surgery, reduced risk of injury to other organs due to better visualization with a 3-D/HD 10 times magnifying camera, shorter hospital stay between 0-1 night and rapid recovery with return to daily routine in 1-2 weeks. Although robot-assisted procedures have a longer operative time than traditional laparotomy, in a patient with good medical history, the benefits usually outweigh the risks. Risks include reaction to anesthesia, excessive bleeding, infection, injury to other organs, and transient post-operative facial edema.  She verbalized understanding of her risks and has consented to proceed with a  Robot Assisted Laparoscopic Bilateral Salpingo-oophorectomy at [facility] on November 11, 2019.       CSN# [id number]   Birtha Hatler J. Lowell Guitar, PA-C  for Dr. Crist Fat. Rivard

## 2019-11-05 ENCOUNTER — Other Ambulatory Visit: Payer: Self-pay

## 2019-11-05 ENCOUNTER — Encounter (HOSPITAL_BASED_OUTPATIENT_CLINIC_OR_DEPARTMENT_OTHER): Payer: Self-pay | Admitting: Obstetrics and Gynecology

## 2019-11-05 NOTE — Progress Notes (Signed)
Spoke w/ via phone for pre-op interview--- PT Lab needs dos--T&S, Istat, Urine preg, EKG     Lab results------ no COVID test ------ 11-08-2019 @ 1105 Arrive at ------- 0530 NPO after MN NO Solid Food.  Clear liquids from MN until--- 0430 Medications to take morning of surgery ----- Synthroid Diabetic medication ----- n/a Patient Special Instructions ----- n/a Pre-Op special Istructions ----- n/a Patient verbalized understanding of instructions that were given at this phone interview. Patient denies shortness of breath, chest pain, fever, cough at this phone interview.

## 2019-11-07 ENCOUNTER — Other Ambulatory Visit (HOSPITAL_COMMUNITY): Payer: Self-pay

## 2019-11-08 ENCOUNTER — Other Ambulatory Visit (HOSPITAL_COMMUNITY)
Admission: RE | Admit: 2019-11-08 | Discharge: 2019-11-08 | Disposition: A | Discharge status: Home / Self Care | Payer: 59 | Source: Ambulatory Visit | Attending: Obstetrics and Gynecology | Admitting: Obstetrics and Gynecology

## 2019-11-08 ENCOUNTER — Other Ambulatory Visit (HOSPITAL_COMMUNITY): Payer: Self-pay

## 2019-11-08 DIAGNOSIS — Z20822 Contact with and (suspected) exposure to covid-19: Secondary | ICD-10-CM | POA: Insufficient documentation

## 2019-11-08 DIAGNOSIS — Z01818 Encounter for other preprocedural examination: Secondary | ICD-10-CM | POA: Diagnosis not present

## 2019-11-08 LAB — SARS CORONAVIRUS 2 (TAT 6-24 HRS): SARS Coronavirus 2: NEGATIVE

## 2019-11-10 NOTE — Anesthesia Preprocedure Evaluation (Addendum)
Anesthesia Evaluation  Patient identified by MRN, date of birth, ID band Patient awake    Reviewed: Allergy & Precautions, NPO status , Patient's Chart, lab work & pertinent test results  History of Anesthesia Complications (+) PONV  Airway Mallampati: II  TM Distance: >3 FB Neck ROM: Full    Dental no notable dental hx. (+) Dental Advisory Given   Pulmonary neg pulmonary ROS,    Pulmonary exam normal        Cardiovascular hypertension, Pt. on medications Normal cardiovascular exam     Neuro/Psych negative neurological ROS     GI/Hepatic negative GI ROS, Neg liver ROS,   Endo/Other  negative endocrine ROS  Renal/GU negative Renal ROS     Musculoskeletal negative musculoskeletal ROS (+)   Abdominal   Peds  Hematology negative hematology ROS (+)   Anesthesia Other Findings   Reproductive/Obstetrics                            Anesthesia Physical Anesthesia Plan  ASA: II  Anesthesia Plan: General   Post-op Pain Management:    Induction: Intravenous  PONV Risk Score and Plan: 4 or greater and Ondansetron, Dexamethasone, Scopolamine patch - Pre-op and Midazolam  Airway Management Planned: Oral ETT  Additional Equipment:   Intra-op Plan:   Post-operative Plan: Extubation in OR  Informed Consent: I have reviewed the patients History and Physical, chart, labs and discussed the procedure including the risks, benefits and alternatives for the proposed anesthesia with the patient or authorized representative who has indicated his/her understanding and acceptance.     Dental advisory given  Plan Discussed with: Anesthesiologist and CRNA  Anesthesia Plan Comments:        Anesthesia Quick Evaluation

## 2019-11-11 ENCOUNTER — Other Ambulatory Visit: Payer: Self-pay

## 2019-11-11 ENCOUNTER — Encounter (HOSPITAL_BASED_OUTPATIENT_CLINIC_OR_DEPARTMENT_OTHER): Payer: Self-pay | Admitting: Obstetrics and Gynecology

## 2019-11-11 ENCOUNTER — Ambulatory Visit (HOSPITAL_BASED_OUTPATIENT_CLINIC_OR_DEPARTMENT_OTHER)
Admission: RE | Admit: 2019-11-11 | Discharge: 2019-11-11 | Disposition: A | Discharge status: Home / Self Care | Payer: 59 | Attending: Obstetrics and Gynecology | Admitting: Obstetrics and Gynecology

## 2019-11-11 ENCOUNTER — Encounter (HOSPITAL_BASED_OUTPATIENT_CLINIC_OR_DEPARTMENT_OTHER)
Admission: RE | Disposition: A | Discharge status: Home / Self Care | Payer: Self-pay | Source: Home / Self Care | Attending: Obstetrics and Gynecology

## 2019-11-11 ENCOUNTER — Ambulatory Visit (HOSPITAL_BASED_OUTPATIENT_CLINIC_OR_DEPARTMENT_OTHER): Payer: 59 | Admitting: Certified Registered"

## 2019-11-11 DIAGNOSIS — N838 Other noninflammatory disorders of ovary, fallopian tube and broad ligament: Secondary | ICD-10-CM | POA: Diagnosis not present

## 2019-11-11 DIAGNOSIS — Z4002 Encounter for prophylactic removal of ovary: Secondary | ICD-10-CM | POA: Diagnosis not present

## 2019-11-11 DIAGNOSIS — E039 Hypothyroidism, unspecified: Secondary | ICD-10-CM | POA: Insufficient documentation

## 2019-11-11 DIAGNOSIS — Z880 Allergy status to penicillin: Secondary | ICD-10-CM | POA: Insufficient documentation

## 2019-11-11 DIAGNOSIS — I10 Essential (primary) hypertension: Secondary | ICD-10-CM | POA: Diagnosis not present

## 2019-11-11 DIAGNOSIS — Z8042 Family history of malignant neoplasm of prostate: Secondary | ICD-10-CM | POA: Insufficient documentation

## 2019-11-11 DIAGNOSIS — Z9189 Other specified personal risk factors, not elsewhere classified: Secondary | ICD-10-CM

## 2019-11-11 DIAGNOSIS — Z79899 Other long term (current) drug therapy: Secondary | ICD-10-CM | POA: Insufficient documentation

## 2019-11-11 DIAGNOSIS — Z8041 Family history of malignant neoplasm of ovary: Secondary | ICD-10-CM | POA: Insufficient documentation

## 2019-11-11 DIAGNOSIS — Z808 Family history of malignant neoplasm of other organs or systems: Secondary | ICD-10-CM | POA: Diagnosis not present

## 2019-11-11 DIAGNOSIS — Z803 Family history of malignant neoplasm of breast: Secondary | ICD-10-CM | POA: Diagnosis not present

## 2019-11-11 HISTORY — DX: Presence of spectacles and contact lenses: Z97.3

## 2019-11-11 HISTORY — DX: Other specified postprocedural states: Z98.890

## 2019-11-11 HISTORY — DX: Nausea with vomiting, unspecified: R11.2

## 2019-11-11 HISTORY — DX: Family history of other specified conditions: Z84.89

## 2019-11-11 HISTORY — PX: ROBOTIC ASSISTED SALPINGO OOPHERECTOMY: SHX6082

## 2019-11-11 HISTORY — DX: Essential (primary) hypertension: I10

## 2019-11-11 LAB — POCT I-STAT, CHEM 8
BUN: 12 mg/dL (ref 6–20)
Calcium, Ion: 1.37 mmol/L (ref 1.15–1.40)
Chloride: 101 mmol/L (ref 98–111)
Creatinine, Ser: 0.8 mg/dL (ref 0.44–1.00)
Glucose, Bld: 100 mg/dL — ABNORMAL HIGH (ref 70–99)
HCT: 45 % (ref 36.0–46.0)
Hemoglobin: 15.3 g/dL — ABNORMAL HIGH (ref 12.0–15.0)
Potassium: 3.7 mmol/L (ref 3.5–5.1)
Sodium: 140 mmol/L (ref 135–145)
TCO2: 26 mmol/L (ref 22–32)

## 2019-11-11 LAB — POCT PREGNANCY, URINE: Preg Test, Ur: NEGATIVE

## 2019-11-11 LAB — TYPE AND SCREEN
ABO/RH(D): O POS
Antibody Screen: NEGATIVE

## 2019-11-11 LAB — ABO/RH: ABO/RH(D): O POS

## 2019-11-11 SURGERY — SALPINGO-OOPHORECTOMY, ROBOT-ASSISTED
Anesthesia: General | Laterality: Bilateral

## 2019-11-11 MED ORDER — FENTANYL CITRATE (PF) 100 MCG/2ML IJ SOLN
INTRAMUSCULAR | Status: DC | PRN
Start: 2019-11-11 — End: 2019-11-11
  Administered 2019-11-11: 25 ug via INTRAVENOUS
  Administered 2019-11-11: 50 ug via INTRAVENOUS
  Administered 2019-11-11: 25 ug via INTRAVENOUS
  Administered 2019-11-11 (×2): 50 ug via INTRAVENOUS

## 2019-11-11 MED ORDER — ACETAMINOPHEN 500 MG PO TABS: ORAL_TABLET | ORAL | 1 refills | Status: DC

## 2019-11-11 MED ORDER — KETOROLAC TROMETHAMINE 30 MG/ML IJ SOLN
INTRAMUSCULAR | Status: DC | PRN
  Administered 2019-11-11: 30 mg via INTRAVENOUS

## 2019-11-11 MED ORDER — DEXAMETHASONE SODIUM PHOSPHATE 10 MG/ML IJ SOLN
INTRAMUSCULAR | Status: AC
  Filled 2019-11-11: qty 1

## 2019-11-11 MED ORDER — SCOPOLAMINE 1 MG/3DAYS TD PT72: 1.0000 | MEDICATED_PATCH | TRANSDERMAL | Status: DC

## 2019-11-11 MED ORDER — PROMETHAZINE HCL 25 MG/ML IJ SOLN: 6.2500 mg | INTRAMUSCULAR | Status: DC | PRN

## 2019-11-11 MED ORDER — LIDOCAINE 2% (20 MG/ML) 5 ML SYRINGE
INTRAMUSCULAR | Status: AC
  Filled 2019-11-11: qty 5

## 2019-11-11 MED ORDER — CELECOXIB 200 MG PO CAPS
ORAL_CAPSULE | ORAL | Status: AC
  Filled 2019-11-11: qty 2

## 2019-11-11 MED ORDER — GABAPENTIN 300 MG PO CAPS
300.0000 mg | ORAL_CAPSULE | ORAL | Status: AC
  Administered 2019-11-11: 300 mg via ORAL

## 2019-11-11 MED ORDER — SODIUM CHLORIDE 0.9 % IR SOLN
Status: DC | PRN
  Administered 2019-11-11: 3000 mL

## 2019-11-11 MED ORDER — SODIUM CHLORIDE (PF) 0.9 % IJ SOLN
INTRAMUSCULAR | Status: DC | PRN
  Administered 2019-11-11: 60 mL

## 2019-11-11 MED ORDER — EPHEDRINE SULFATE-NACL 50-0.9 MG/10ML-% IV SOSY
PREFILLED_SYRINGE | INTRAVENOUS | Status: DC | PRN
  Administered 2019-11-11 (×2): 10 mg via INTRAVENOUS

## 2019-11-11 MED ORDER — SUGAMMADEX SODIUM 200 MG/2ML IV SOLN
INTRAVENOUS | Status: DC | PRN
  Administered 2019-11-11: 120 mg via INTRAVENOUS

## 2019-11-11 MED ORDER — ESTRADIOL 0.1 MG/GM VA CREA
TOPICAL_CREAM | VAGINAL | Status: DC | PRN
  Administered 2019-11-11: 1 via VAGINAL

## 2019-11-11 MED ORDER — SCOPOLAMINE 1 MG/3DAYS TD PT72
MEDICATED_PATCH | TRANSDERMAL | Status: AC
  Filled 2019-11-11: qty 1

## 2019-11-11 MED ORDER — SCOPOLAMINE 1 MG/3DAYS TD PT72
1.0000 | MEDICATED_PATCH | TRANSDERMAL | Status: DC
  Administered 2019-11-11: 1.5 mg via TRANSDERMAL

## 2019-11-11 MED ORDER — ACETAMINOPHEN 500 MG PO TABS: 1000.0000 mg | ORAL_TABLET | Freq: Once | ORAL | Status: DC

## 2019-11-11 MED ORDER — FENTANYL CITRATE (PF) 100 MCG/2ML IJ SOLN
INTRAMUSCULAR | Status: AC
  Filled 2019-11-11: qty 2

## 2019-11-11 MED ORDER — ROCURONIUM BROMIDE 10 MG/ML (PF) SYRINGE
PREFILLED_SYRINGE | INTRAVENOUS | Status: DC | PRN
  Administered 2019-11-11: 80 mg via INTRAVENOUS

## 2019-11-11 MED ORDER — LACTATED RINGERS IV SOLN: INTRAVENOUS | Status: DC

## 2019-11-11 MED ORDER — ROCURONIUM BROMIDE 10 MG/ML (PF) SYRINGE
PREFILLED_SYRINGE | INTRAVENOUS | Status: AC
  Filled 2019-11-11: qty 10

## 2019-11-11 MED ORDER — OXYCODONE HCL 5 MG PO TABS: ORAL_TABLET | ORAL | 0 refills | Status: DC

## 2019-11-11 MED ORDER — FENTANYL CITRATE (PF) 100 MCG/2ML IJ SOLN: 25.0000 ug | INTRAMUSCULAR | Status: DC | PRN

## 2019-11-11 MED ORDER — ACETAMINOPHEN 500 MG PO TABS
1000.0000 mg | ORAL_TABLET | ORAL | Status: AC
  Administered 2019-11-11: 1000 mg via ORAL

## 2019-11-11 MED ORDER — IBUPROFEN 600 MG PO TABS: ORAL_TABLET | ORAL | 1 refills | Status: DC

## 2019-11-11 MED ORDER — ONDANSETRON HCL 4 MG/2ML IJ SOLN
INTRAMUSCULAR | Status: DC | PRN
  Administered 2019-11-11: 4 mg via INTRAVENOUS

## 2019-11-11 MED ORDER — LIDOCAINE 2% (20 MG/ML) 5 ML SYRINGE
INTRAMUSCULAR | Status: DC | PRN
  Administered 2019-11-11: 100 mg via INTRAVENOUS

## 2019-11-11 MED ORDER — MIDAZOLAM HCL 2 MG/2ML IJ SOLN
INTRAMUSCULAR | Status: DC | PRN
  Administered 2019-11-11: 2 mg via INTRAVENOUS

## 2019-11-11 MED ORDER — DEXAMETHASONE SODIUM PHOSPHATE 10 MG/ML IJ SOLN: 4.0000 mg | INTRAMUSCULAR | Status: DC

## 2019-11-11 MED ORDER — ENSURE PRE-SURGERY PO LIQD: 296.0000 mL | Freq: Once | ORAL | Status: DC

## 2019-11-11 MED ORDER — PROPOFOL 10 MG/ML IV BOLUS
INTRAVENOUS | Status: DC | PRN
  Administered 2019-11-11: 200 mg via INTRAVENOUS

## 2019-11-11 MED ORDER — ROPIVACAINE HCL 0.5 % EP SOLN
EPIDURAL | Status: DC | PRN
  Administered 2019-11-11: 60 mL via SURGICAL_CAVITY

## 2019-11-11 MED ORDER — ACETAMINOPHEN 500 MG PO TABS
ORAL_TABLET | ORAL | Status: AC
  Filled 2019-11-11: qty 2

## 2019-11-11 MED ORDER — DROSPIRENONE-ETHINYL ESTRADIOL 3-0.03 MG PO TABS: ORAL_TABLET | ORAL | 11 refills | Status: DC

## 2019-11-11 MED ORDER — MIDAZOLAM HCL 2 MG/2ML IJ SOLN
INTRAMUSCULAR | Status: AC
  Filled 2019-11-11: qty 2

## 2019-11-11 MED ORDER — PROPOFOL 10 MG/ML IV BOLUS
INTRAVENOUS | Status: AC
  Filled 2019-11-11: qty 20

## 2019-11-11 MED ORDER — KETOROLAC TROMETHAMINE 30 MG/ML IJ SOLN
INTRAMUSCULAR | Status: AC
  Filled 2019-11-11: qty 1

## 2019-11-11 MED ORDER — STERILE WATER FOR IRRIGATION IR SOLN
Status: DC | PRN
  Administered 2019-11-11: 500 mL

## 2019-11-11 MED ORDER — ONDANSETRON HCL 4 MG/2ML IJ SOLN
INTRAMUSCULAR | Status: AC
  Filled 2019-11-11: qty 2

## 2019-11-11 MED ORDER — DEXAMETHASONE SODIUM PHOSPHATE 10 MG/ML IJ SOLN
INTRAMUSCULAR | Status: DC | PRN
  Administered 2019-11-11 (×2): 5 mg via INTRAVENOUS

## 2019-11-11 MED ORDER — CELECOXIB 200 MG PO CAPS: 400.0000 mg | ORAL_CAPSULE | ORAL | Status: DC

## 2019-11-11 MED ORDER — GABAPENTIN 300 MG PO CAPS
ORAL_CAPSULE | ORAL | Status: AC
  Filled 2019-11-11: qty 1

## 2019-11-11 MED ORDER — EPHEDRINE 5 MG/ML INJ
INTRAVENOUS | Status: AC
  Filled 2019-11-11: qty 10

## 2019-11-11 MED FILL — Ropivacaine HCl Inj 5 MG/ML: INTRAMUSCULAR | Qty: 60 | Status: AC

## 2019-11-11 SURGICAL SUPPLY — 74 items
ADH SKN CLS APL DERMABOND .7 (GAUZE/BANDAGES/DRESSINGS) ×1
BARRIER ADHS 3X4 INTERCEED (GAUZE/BANDAGES/DRESSINGS) ×2 IMPLANT
BLADE LAP MORCELLATOR 15X9.5 (ELECTROSURGICAL) IMPLANT
BLADE MORCELLATOR EXT  12.5X15 (ELECTROSURGICAL)
BLADE MORCELLATOR EXT 12.5X15 (ELECTROSURGICAL) IMPLANT
BRR ADH 4X3 ABS CNTRL BYND (GAUZE/BANDAGES/DRESSINGS) ×1
CANISTER SUCT 3000ML PPV (MISCELLANEOUS) ×2 IMPLANT
CATH FOLEY 2WAY  3CC  8FR (CATHETERS) ×2
CATH FOLEY 2WAY 3CC 8FR (CATHETERS) IMPLANT
CATH FOLEY 3WAY  5CC 16FR (CATHETERS) ×2
CATH FOLEY 3WAY 5CC 16FR (CATHETERS) ×1 IMPLANT
COVER BACK TABLE 60X90IN (DRAPES) ×2 IMPLANT
COVER MAYO STAND STRL (DRAPES) ×2 IMPLANT
COVER TIP SHEARS 8 DVNC (MISCELLANEOUS) ×1 IMPLANT
COVER TIP SHEARS 8MM DA VINCI (MISCELLANEOUS) ×2
COVER WAND RF STERILE (DRAPES) ×2 IMPLANT
DECANTER SPIKE VIAL GLASS SM (MISCELLANEOUS) ×4 IMPLANT
DEFOGGER SCOPE WARMER CLEARIFY (MISCELLANEOUS) ×4 IMPLANT
DERMABOND ADVANCED (GAUZE/BANDAGES/DRESSINGS) ×1
DERMABOND ADVANCED .7 DNX12 (GAUZE/BANDAGES/DRESSINGS) ×1 IMPLANT
DILATOR CANAL MILEX (MISCELLANEOUS) IMPLANT
DRAPE ARM DVNC X/XI (DISPOSABLE) ×4 IMPLANT
DRAPE COLUMN DVNC XI (DISPOSABLE) ×1 IMPLANT
DRAPE DA VINCI XI ARM (DISPOSABLE) ×8
DRAPE DA VINCI XI COLUMN (DISPOSABLE) ×2
DRAPE UTILITY XL STRL (DRAPES) ×2 IMPLANT
DURAPREP 26ML APPLICATOR (WOUND CARE) ×2 IMPLANT
ELECT REM PT RETURN 9FT ADLT (ELECTROSURGICAL) ×2
ELECTRODE REM PT RTRN 9FT ADLT (ELECTROSURGICAL) ×1 IMPLANT
GAUZE 4X4 16PLY RFD (DISPOSABLE) ×2 IMPLANT
GLOVE BIO SURGEON STRL SZ7 (GLOVE) ×2 IMPLANT
GLOVE BIOGEL PI IND STRL 7.0 (GLOVE) ×5 IMPLANT
GLOVE BIOGEL PI INDICATOR 7.0 (GLOVE) ×5
GLOVE ECLIPSE 6.5 STRL STRAW (GLOVE) ×7 IMPLANT
HOLDER FOLEY CATH W/STRAP (MISCELLANEOUS) IMPLANT
IRRIG SUCT STRYKERFLOW 2 WTIP (MISCELLANEOUS) ×2
IRRIGATION SUCT STRKRFLW 2 WTP (MISCELLANEOUS) ×1 IMPLANT
LEGGING LITHOTOMY PAIR STRL (DRAPES) ×3 IMPLANT
NEEDLE HYPO 22GX1.5 SAFETY (NEEDLE) ×2 IMPLANT
OBTURATOR OPTICAL STANDARD 8MM (TROCAR) ×2
OBTURATOR OPTICAL STND 8 DVNC (TROCAR) ×1
OBTURATOR OPTICALSTD 8 DVNC (TROCAR) ×1 IMPLANT
OCCLUDER COLPOPNEUMO (BALLOONS) ×2 IMPLANT
PACK ROBOT WH (CUSTOM PROCEDURE TRAY) ×2 IMPLANT
PACK ROBOTIC GOWN (GOWN DISPOSABLE) ×2 IMPLANT
PACK TRENDGUARD 450 HYBRID PRO (MISCELLANEOUS) ×1 IMPLANT
PAD OB MATERNITY 4.3X12.25 (PERSONAL CARE ITEMS) ×4 IMPLANT
PAD PREP 24X48 CUFFED NSTRL (MISCELLANEOUS) ×2 IMPLANT
POUCH LAPAROSCOPIC INSTRUMENT (MISCELLANEOUS) ×2 IMPLANT
PROTECTOR NERVE ULNAR (MISCELLANEOUS) ×6 IMPLANT
SEAL CANN UNIV 5-8 DVNC XI (MISCELLANEOUS) ×4 IMPLANT
SEAL XI 5MM-8MM UNIVERSAL (MISCELLANEOUS) ×8
SEALER VESSEL DA VINCI XI (MISCELLANEOUS) ×2
SEALER VESSEL EXT DVNC XI (MISCELLANEOUS) IMPLANT
SET IRRIG Y TYPE TUR BLADDER L (SET/KITS/TRAYS/PACK) IMPLANT
SET TRI-LUMEN FLTR TB AIRSEAL (TUBING) ×2 IMPLANT
SOL PREP PROV IODINE SCRUB 4OZ (MISCELLANEOUS) ×2 IMPLANT
STRIP CLOSURE SKIN 1/4X4 (GAUZE/BANDAGES/DRESSINGS) ×2 IMPLANT
SUT MNCRL AB 3-0 PS2 27 (SUTURE) ×4 IMPLANT
SUT VIC AB 0 CT1 27 (SUTURE) ×4
SUT VIC AB 0 CT1 27XBRD ANBCTR (SUTURE) ×2 IMPLANT
SUT VIC AB 2-0 UR6 27 (SUTURE) ×4 IMPLANT
SUT VICRYL 0 UR6 27IN ABS (SUTURE) ×2 IMPLANT
SUT VLOC 180 0 9IN  GS21 (SUTURE) ×4
SUT VLOC 180 0 9IN GS21 (SUTURE) ×2 IMPLANT
TIP RUMI ORANGE 6.7MMX12CM (TIP) IMPLANT
TIP UTERINE 5.1X6CM LAV DISP (MISCELLANEOUS) ×1 IMPLANT
TIP UTERINE 6.7X10CM GRN DISP (MISCELLANEOUS) IMPLANT
TIP UTERINE 6.7X6CM WHT DISP (MISCELLANEOUS) IMPLANT
TIP UTERINE 6.7X8CM BLUE DISP (MISCELLANEOUS) IMPLANT
TOWEL OR 17X26 10 PK STRL BLUE (TOWEL DISPOSABLE) ×2 IMPLANT
TRENDGUARD 450 HYBRID PRO PACK (MISCELLANEOUS) ×2
TROCAR PORT AIRSEAL 8X120 (TROCAR) ×2 IMPLANT
WATER STERILE IRR 1000ML POUR (IV SOLUTION) ×2 IMPLANT

## 2019-11-11 NOTE — Progress Notes (Signed)
Patient called requesting clarification for dosing schedule for Drospirenone/Ethnyl Estradiol 3/30 mg tablets. Informed patient per AVS to take 3 times a day for 3 days, 2 times a day for 3 days then 1 time a day for 2 weeks. Patient verbalized understanding of instructions and had no further questions at this time.  Starla Link, RN

## 2019-11-11 NOTE — Op Note (Signed)
Preoperative diagnosis: family history of ovarian cancer  Postoperative diagnosis: same  Anesthesia: General   Anesthesiologist: Dr. Krista Blue  Procedure: Robotically assisted bilateral salpyngo-oophorectomy  Surgeon: Dr. Dois Davenport Ausha Sieh   Assistant: Henreitta Leber P.A.-C .  Estimated blood loss: 50 cc  Procedure:   After being informed of the planned procedure with possible complications including but not limited to bleeding, infection, injury to other organs, need for laparotomy, expected hospital stay and recovery, informed consent is obtained and patient is taken to OR #5. She is placed in lithotomy position on Trengard with both arms padded and tucked on each side and bilateral knee-high sequential compressive devices. She is given general anesthesia with endotracheal intubation without any complication. She is prepped and draped in a sterile fashion. A Foley catheter is inserted in her bladder.  Pelvic exam reveals: anteverted uterus with 2 normal adnexa  A weighted speculum is inserted in the vagina and the anterior lip of the cervix is grasped with a tenaculum forcep. We proceed with a paracervical block and vaginal infiltration using ropivacaine 0.5% diluted 1 in 1 with saline. The uterus was then sounded at 6 cm. We easily dilate the cervix using Hegar dilator to #27 which allows for easy placement of the intrauterine RUMI manipulator.  Trocar placement is decided. We infiltrate at the umbilicus with 10 cc of ropivacaine per protocol and perform a 10 mm vertical  incision which is brought down bluntly to the fascia. The fascia is identified and grasped with Coker forceps. The fascia is incised with Mayo scissors. Peritoneum is entered bluntly. A pursestring suture of 0 Vicryl is placed on the fascia and a 10 mm Hassan trocar is easily inserted in the abdominal cavity held in placed with a Purstring suture. This allows for easy insufflation of a pneumoperitoneum using warmed CO2 at a  maximum pressure of 15 mm of mercury. 60 cc of Ropivacaine 0.5 % diluted 1 in 1 is sent in the pelvis and the patient is positioned in reverse Trendelenburg. We then placed one 47mm robotic trocar on the left and one 29mm robotic trocar on the right  after infiltrating every site with ropivacaine per protocol. The robot is docked on the left of the patient after positioning her in Trendelenburg. A Long bipolar forceps  is inserted in arm #3 and a vessel seal  is inserted in arm #1.  Preparation and docking is completed in 46 minutes.   Observation: uterus, tubes, ovaries, anerior and posterior cul-de-sac, liver and gallbladder are normal. Grade 1 adhesions are noted between right colon and abdominal wall. Appendix is not seen.    Rolling the right ovary to the left we seal and section the right infundibulopelvic ligament, with the right ureter under direct visualization. We seal and section the utero-ovarian ligament, the tube at its insertion with the uterus and the mesosalpynx to free the tube and the ovary.   We proceed in the exact same fashion on the left side, also with direct visualization of the left ureter. Hemostasis is adequate.  Console time is 18 minutes. Instruments are removed and the robot is undocked.  The specimen is removed through the umbilical incision. We then irrigate the pelvis profusely with warm saline and confirm satisfactory hemostasis.  All trochars are removed under direct visualization after evacuating the pneumoperitoneum.   The fascia of the supraumbilical incision is closed with the previously placed pursestring suture of 0 Vicryl. All incisions are then closed with subcuticular suture of 3-0 Monocryl and Dermabond.  A speculum is inserted in the vagina where active bleeding is identified coming from the uterus. A #8 Foley is inserted in the uterus and its balloon is inflated with 6 cc of saline. We maintain this tamponnade for several minutes. We  progressively deflated the ballon and removed it. There is mild bleeding which is believed to be from significant atrophy. Estrace cream is applied externally for comfort with significant irritation from the vaginal prep.  Instrument and sponge count is complete x2. Estimated blood loss is 50 cc.    The procedure is well tolerated by the patient who is taken to recovery room in a well and stable condition.   Specimen: Both ovaries and both tubes sent to pathology.

## 2019-11-11 NOTE — Anesthesia Procedure Notes (Signed)
Procedure Name: Intubation Date/Time: 11/11/2019 7:33 AM Performed by: Suan Halter, CRNA Pre-anesthesia Checklist: Patient identified, Emergency Drugs available, Suction available and Patient being monitored Patient Re-evaluated:Patient Re-evaluated prior to induction Oxygen Delivery Method: Circle system utilized Preoxygenation: Pre-oxygenation with 100% oxygen Induction Type: IV induction Ventilation: Mask ventilation without difficulty Laryngoscope Size: Mac and 3 Grade View: Grade I Tube type: Oral Tube size: 7.0 mm Number of attempts: 1 Airway Equipment and Method: Stylet and Oral airway Placement Confirmation: ETT inserted through vocal cords under direct vision,  positive ETCO2 and breath sounds checked- equal and bilateral Secured at: 22 cm Tube secured with: Tape Dental Injury: Teeth and Oropharynx as per pre-operative assessment

## 2019-11-11 NOTE — Anesthesia Postprocedure Evaluation (Signed)
Anesthesia Post Note  Patient: Renee Keith  Procedure(s) Performed: XI ROBOTIC ASSISTED SALPINGO OOPHORECTOMY (Bilateral )     Patient location during evaluation: PACU Anesthesia Type: General Level of consciousness: sedated Pain management: pain level controlled Vital Signs Assessment: post-procedure vital signs reviewed and stable Respiratory status: spontaneous breathing and respiratory function stable Cardiovascular status: stable Postop Assessment: no apparent nausea or vomiting Anesthetic complications: no   No complications documented.  Last Vitals:  Vitals:   11/11/19 1045 11/11/19 1126  BP: (!) 142/92 (!) 171/109  Pulse: 65 81  Resp: 10 14  Temp:  (!) 36.3 C  SpO2: 98% 99%    Last Pain:  Vitals:   11/11/19 1126  TempSrc:   PainSc: 0-No pain                 Jalynn Waddell DANIEL

## 2019-11-11 NOTE — Transfer of Care (Signed)
Immediate Anesthesia Transfer of Care Note  Patient: Renee Keith  Procedure(s) Performed: Procedure(s) (LRB): XI ROBOTIC ASSISTED SALPINGO OOPHORECTOMY (Bilateral)  Patient Location: PACU  Anesthesia Type: General  Level of Consciousness: awake, oriented, sedated and patient cooperative  Airway & Oxygen Therapy: Patient Spontanous Breathing and Patient connected to face mask oxygen  Post-op Assessment: Report given to PACU RN and Post -op Vital signs reviewed and stable  Post vital signs: Reviewed and stable  Complications: No apparent anesthesia complications Last Vitals:  Vitals Value Taken Time  BP 151/103 11/11/19 1012  Temp    Pulse 68 11/11/19 1017  Resp 10 11/11/19 1017  SpO2 97 % 11/11/19 1017  Vitals shown include unvalidated device data.  Last Pain:  Vitals:   11/11/19 0644  TempSrc: Oral      Patients Stated Pain Goal: 6 (11/11/19 5885)  Complications: No complications documented.

## 2019-11-11 NOTE — Discharge Instructions (Signed)
Call [facility] @ [phone] if:  You have a temperature greater than or equal to 100.4 degrees Farenheit orally You have pain that is not made better by the pain medication given and taken as directed You have excessive bleeding or problems urinating  Take Colace (Docusate Sodium/Stool Softener) 100 mg 2-3 times daily to avoid constipation or until bowel movements are regular. Take Ibuprofen 600 mg and Acetaminophen #2-500 mg tablets, with food, every 6 hours for 5 days then as needed for pain  Take your Drospirenone/Ethnyl Estradiol 3/30 mg tablets:   3 times a day for 3 days, 2 times a day for 3 days then 1 time a day for 2 weeks  You may drive after 24  hours You may walk up steps  You may shower tomorrow You may resume a regular diet  Keep incisions clean and dry Do not lift over 15 pounds for 6 weeks Avoid anything in vagina for until after your post-operative visit  DISCHARGE INSTRUCTIONS: Laparoscopy  The following instructions have been prepared to help you care for yourself upon your return home today.  Wound care: Marland Kitchen Do not get the incision wet for the first 24 hours. The incision should be kept clean and dry. . The Band-Aids or dressings may be removed the day after surgery. . Should the incision become sore, red, and swollen after the first week, check with your doctor.  Personal hygiene: . Shower the day after your procedure.  Activity and limitations: . Do NOT drive or operate any equipment today. . Do NOT lift anything more than 15 pounds for 2-3 weeks after surgery. . Do NOT rest in bed all day. . Walking is encouraged. Walk each day, starting slowly with 5-minute walks 3 or 4 times a day. Slowly increase the length of your walks. . Walk up and down stairs slowly. . Do NOT do strenuous activities, such as golfing, playing tennis, bowling, running, biking, weight lifting, gardening, mowing, or vacuuming for 2-4 weeks. Ask your doctor when it is  okay to start.  Diet: Eat a light meal as desired this evening. You may resume your usual diet tomorrow.  Return to work: This is dependent on the type of work you do. For the most part you can return to a desk job within a week of surgery. If you are more active at work, please discuss this with your doctor.  What to expect after your surgery: You may have a slight burning sensation when you urinate on the first day. You may have a very small amount of blood in the urine. Expect to have a small amount of vaginal discharge/light bleeding for 1-2 weeks. It is not unusual to have abdominal soreness and bruising for up to 2 weeks. You may be tired and need more rest for about 1 week. You may experience shoulder pain for 24-72 hours. Lying flat in bed may relieve it.  Call your doctor for any of the following: . Develop a fever of 100.4 or greater . Inability to urinate 6 hours after discharge from hospital . Severe pain not relieved by pain medications . Persistent of heavy bleeding at incision site . Redness or swelling around incision site after a week . Increasing nausea or vomiting   Post Anesthesia Home Care Instructions  Activity: Get plenty of rest for the remainder of the day. A responsible individual must stay with you for 24 hours following the procedure.  For the next 24 hours, DO NOT: -Drive a  car -Advertising copywriter -Drink alcoholic beverages -Take any medication unless instructed by your physician -Make any legal decisions or sign important papers.  Meals: Start with liquid foods such as gelatin or soup. Progress to regular foods as tolerated. Avoid greasy, spicy, heavy foods. If nausea and/or vomiting occur, drink only clear liquids until the nausea and/or vomiting subsides. Call your physician if vomiting continues.  Special Instructions/Symptoms: Your throat may feel dry or sore from the anesthesia or the breathing tube placed in your throat during surgery. If this causes  discomfort, gargle with warm salt water. The discomfort should disappear within 24 hours.  If you had a scopolamine patch placed behind your ear for the management of post- operative nausea and/or vomiting:  1. The medication in the patch is effective for 72 hours, after which it should be removed.  Wrap patch in a tissue and discard in the trash. Wash hands thoroughly with soap and water. 2. You may remove the patch earlier than 72 hours if you experience unpleasant side effects which may include dry mouth, dizziness or visual disturbances. 3. Avoid touching the patch. Wash your hands with soap and water after contact with the patch.

## 2019-11-11 NOTE — Interval H&P Note (Signed)
History and Physical Interval Note:  11/11/2019 7:19 AM  Renee Keith Renee Keith  has presented today for surgery, with the diagnosis of Family History of Ovarian Cancer.  The various methods of treatment have been discussed with the patient and family. After consideration of risks, benefits and other options for treatment, the patient has consented to  Procedure(s): XI ROBOTIC ASSISTED SALPINGO OOPHORECTOMY (Bilateral) as a surgical intervention.  The patient's history has been reviewed, patient examined, no change in status, stable for surgery.  I have reviewed the patient's chart and labs.  Questions were answered to the patient's satisfaction.     Renee Keith

## 2019-11-12 ENCOUNTER — Encounter (HOSPITAL_BASED_OUTPATIENT_CLINIC_OR_DEPARTMENT_OTHER): Payer: Self-pay | Admitting: Obstetrics and Gynecology

## 2019-11-12 LAB — SURGICAL PATHOLOGY

## 2020-09-10 ENCOUNTER — Telehealth: Payer: Self-pay

## 2020-09-10 NOTE — Telephone Encounter (Signed)
Referral notes sent from [facility] , Phone #: [phone], Fax #: [phone]   Notes sent to scheduling

## 2020-09-16 NOTE — Telephone Encounter (Signed)
Left a message for  [name] , Phone #: [phone], Fax #: [phone], to send over medical records for new pt referral.  Received only labs and referral..jw 09/16/2020

## 2020-11-30 ENCOUNTER — Ambulatory Visit: Payer: 59 | Admitting: Cardiology

## 2021-01-13 ENCOUNTER — Ambulatory Visit: Payer: 59 | Admitting: Cardiology

## 2021-01-31 ENCOUNTER — Ambulatory Visit: Payer: 59 | Admitting: Cardiology

## 2021-03-11 ENCOUNTER — Ambulatory Visit: Payer: 59 | Admitting: Cardiology

## 2021-04-12 ENCOUNTER — Ambulatory Visit: Payer: 59 | Admitting: Cardiology

## 2021-05-04 ENCOUNTER — Ambulatory Visit: Payer: 59 | Admitting: Cardiology

## 2021-05-05 ENCOUNTER — Ambulatory Visit: Payer: 59 | Admitting: Cardiology

## 2021-05-05 ENCOUNTER — Encounter: Payer: Self-pay | Admitting: Cardiology

## 2021-05-05 ENCOUNTER — Other Ambulatory Visit: Payer: Self-pay

## 2021-05-05 VITALS — BP 126/92 | HR 100 | Ht 66.0 in | Wt 136.0 lb

## 2021-05-05 DIAGNOSIS — E7800 Pure hypercholesterolemia, unspecified: Secondary | ICD-10-CM

## 2021-05-05 DIAGNOSIS — I10 Essential (primary) hypertension: Secondary | ICD-10-CM

## 2021-05-05 MED ORDER — AMLODIPINE BESYLATE 2.5 MG PO TABS: 2.5000 mg | ORAL_TABLET | Freq: Every day | ORAL | 3 refills | Status: DC

## 2021-05-05 NOTE — Patient Instructions (Addendum)
Medication Instructions:  ?1) START Amlodipine 2.5mg  once daily.  Monitor your blood pressure once a day, at least 2 hours after medication, for a week and then call or send a MyChart message with those readings.  ? ?*If you need a refill on your cardiac medications before your next appointment, please call your pharmacy* ? ? ?Lab Work: ?None ?If you have labs (blood work) drawn today and your tests are completely normal, you will receive your results only by: ?MyChart Message (if you have MyChart) OR ?A paper copy in the mail ?If you have any lab test that is abnormal or we need to change your treatment, we will call you to review the results. ? ? ?Testing/Procedures: ?Your physician recommends that you have a Calcium Score performed. ? ? ?Follow-Up: ?At [facility], you and your health needs are our priority.  As part of our continuing mission to provide you with exceptional heart care, we have created designated Provider Care Teams.  These Care Teams include your primary Cardiologist (physician) and Advanced Practice Providers (APPs -  Physician Assistants and Nurse Practitioners) who all work together to provide you with the care you need, when you need it. ? ?We recommend signing up for the patient portal called "MyChart".  Sign up information is provided on this After Visit Summary.  MyChart is used to connect with patients for Virtual Visits (Telemedicine).  Patients are able to view lab/test results, encounter notes, upcoming appointments, etc.  Non-urgent messages can be sent to your provider as well.   ?To learn more about what you can do with MyChart, go to [email or web address].   ? ?Your next appointment:   ?3 month(s) ? ?The format for your next appointment:   ?In Person ? ?Provider:   ?Armanda Magic, MD   ? ? ?Other Instructions ? ?Heart-Healthy Eating Plan ?Heart-healthy meal planning includes: ?Eating less unhealthy fats. ?Eating more healthy fats. ?Making other changes in your diet. ?Talk with  your doctor or a diet specialist (dietitian) to create an eating plan that is right for you. ?What is my plan? ?Your doctor may recommend an eating plan that includes: ?Total fat: ______% or less of total calories a day. ?Saturated fat: ______% or less of total calories a day. ?Cholesterol: less than _________mg a day. ?What are tips for following this plan? ?Cooking ?Avoid frying your food. Try to bake, boil, grill, or broil it instead. You can also reduce fat by: ?Removing the skin from poultry. ?Removing all visible fats from meats. ?Steaming vegetables in water or broth. ?Meal planning ? ?At meals, divide your plate into four equal parts: ?Fill one-half of your plate with vegetables and green salads. ?Fill one-fourth of your plate with whole grains. ?Fill one-fourth of your plate with lean protein foods. ?Eat 4-5 servings of vegetables per day. A serving of vegetables is: ?1 cup of raw or cooked vegetables. ?2 cups of raw leafy greens. ?Eat 4-5 servings of fruit per day. A serving of fruit is: ?1 medium whole fruit. ?? cup of dried fruit. ?? cup of fresh, frozen, or canned fruit. ?? cup of 100% fruit juice. ?Eat more foods that have soluble fiber. These are apples, broccoli, carrots, beans, peas, and barley. Try to get 20-30 g of fiber per day. ?Eat 4-5 servings of nuts, legumes, and seeds per week: ?1 serving of dried beans or legumes equals ? cup after being cooked. ?1 serving of nuts is ? cup. ?1 serving of seeds equals 1 tablespoon. ?General information ?Eat  more home-cooked food. Eat less restaurant, buffet, and fast food. ?Limit or avoid alcohol. ?Limit foods that are high in starch and sugar. ?Avoid fried foods. ?Lose weight if you are overweight. ?Keep track of how much salt (sodium) you eat. This is important if you have high blood pressure. Ask your doctor to tell you more about this. ?Try to add vegetarian meals each week. ?Fats ?Choose healthy fats. These include olive oil and canola oil, flaxseeds,  walnuts, almonds, and seeds. ?Eat more omega-3 fats. These include salmon, mackerel, sardines, tuna, flaxseed oil, and ground flaxseeds. Try to eat fish at least 2 times each week. ?Check food labels. Avoid foods with trans fats or high amounts of saturated fat. ?Limit saturated fats. ?These are often found in animal products, such as meats, butter, and cream. ?These are also found in plant foods, such as palm oil, palm kernel oil, and coconut oil. ?Avoid foods with partially hydrogenated oils in them. These have trans fats. Examples are stick margarine, some tub margarines, cookies, crackers, and other baked goods. ?What foods can I eat? ?Fruits ?All fresh, canned (in natural juice), or frozen fruits. ?Vegetables ?Fresh or frozen vegetables (raw, steamed, roasted, or grilled). Green salads. ?Grains ?Most grains. Choose whole wheat and whole grains most of the time. Rice and pasta, including brown rice and pastas made with whole wheat. ?Meats and other proteins ?Lean, well-trimmed beef, veal, pork, and lamb. Chicken and [place] without skin. All fish and shellfish. Wild duck, rabbit, pheasant, and venison. Egg whites or low-cholesterol egg substitutes. Dried beans, peas, lentils, and tofu. Seeds and most nuts. ?Dairy ?Low-fat or nonfat cheeses, including ricotta and mozzarella. Skim or 1% milk that is liquid, powdered, or evaporated. Buttermilk that is made with low-fat milk. Nonfat or low-fat yogurt. ?Fats and oils ?Non-hydrogenated (trans-free) margarines. Vegetable oils, including soybean, sesame, sunflower, olive, peanut, safflower, corn, canola, and cottonseed. Salad dressings or mayonnaise made with a vegetable oil. ?Beverages ?Mineral water. Coffee and tea. Diet carbonated beverages. ?Sweets and desserts ?Sherbet, gelatin, and fruit ice. Small amounts of dark chocolate. ?Limit all sweets and desserts. ?Seasonings and condiments ?All seasonings and condiments. ?The items listed above may not be a complete list  of foods and drinks you can eat. Contact a dietitian for more options. ?What foods should I avoid? ?Fruits ?Canned fruit in heavy syrup. Fruit in cream or butter sauce. Fried fruit. Limit coconut. ?Vegetables ?Vegetables cooked in cheese, cream, or butter sauce. Fried vegetables. ?Grains ?Breads that are made with saturated or trans fats, oils, or whole milk. Croissants. Sweet rolls. Donuts. High-fat crackers, such as cheese crackers. ?Meats and other proteins ?Fatty meats, such as hot dogs, ribs, sausage, bacon, rib-eye roast or steak. High-fat deli meats, such as salami and bologna. Caviar. Domestic duck and goose. Organ meats, such as liver. ?Dairy ?Cream, sour cream, cream cheese, and creamed cottage cheese. Whole-milk cheeses. Whole or 2% milk that is liquid, evaporated, or condensed. Whole buttermilk. Cream sauce or high-fat cheese sauce. Yogurt that is made from whole milk. ?Fats and oils ?Meat fat, or shortening. Cocoa butter, hydrogenated oils, palm oil, coconut oil, palm kernel oil. Solid fats and shortenings, including bacon fat, salt pork, lard, and butter. Nondairy cream substitutes. Salad dressings with cheese or sour cream. ?Beverages ?Regular sodas and juice drinks with added sugar. ?Sweets and desserts ?Frosting. Pudding. Cookies. Cakes. Pies. Milk chocolate or white chocolate. Buttered syrups. Full-fat ice cream or ice cream drinks. ?The items listed above may not be a complete list of  foods and drinks to avoid. Contact a dietitian for more information. ?Summary ?Heart-healthy meal planning includes eating less unhealthy fats, eating more healthy fats, and making other changes in your diet. ?Eat a balanced diet. This includes fruits and vegetables, low-fat or nonfat dairy, lean protein, nuts and legumes, whole grains, and heart-healthy oils and fats. ?This information is not intended to replace advice given to you by your health care provider. Make sure you discuss any questions you have with your  health care provider. ?Document Revised: 06/24/2020 Document Reviewed: 06/24/2020 ?[employer]vier Patient Education ? 2022 [employer] ?  ?

## 2021-05-05 NOTE — Progress Notes (Signed)
Cardiology CONSULT Note    Date:  05/06/2021   ID:  Renee Keith, Renee Keith 10/25/1965, MRN [id number]  PCP:  Gweneth Dimitri, MD  Cardiologist:  Armanda Magic, MD   Chief Complaint  Patient presents with   New Patient (Initial Visit)    Hypertension    History of Present Illness:  Renee Keith is a 56 y.o. female who is being seen today for the evaluation of Hypertension at the request of Rivard, Dois Davenport, MD.  This is a very pleasant 56 year old female with a history of hypertension for several years followed by her PCP.  She has a very strong family history of cancer.  Her mom died of ovarian cancer at 58.  She is unaware of any cardiac history in first-degree relatives but a grandparent on her mom side had coronary disease.  She has never smoked.  She has been maintained on losartan HCT initially but then developed hypercalcemia and the diuretic was stopped and her losartan was increased to 100 mg daily she has continued to have some elevations in her diastolic blood pressure.  In review of notes sent over by Dr. Estanislado Pandy and review of notes sent over by Dr. Estanislado Pandy, her cholesterol is also elevated with a total cholesterol 264, triglycerides 169, HDL 76 and LDL 158.  Her LDL goal is less than 130 and preferably less than 100.  She is postmenopausal.  She denies any chest pain or shortness of breath PND orthopnea lower extremity edema palpitations dizziness or syncope.   Past Medical History:  Diagnosis Date   Family history of adverse reaction to anesthesia    pt son-- ponv   Family history of breast cancer    Family history of melanoma    Family history of ovarian cancer    Family history of prostate cancer    Hypertension    followed by pcp  (per pt never had a stress test)   Hypothyroidism    followed by pcp   PONV (postoperative nausea and vomiting)    Wears contact lenses     Past Surgical History:  Procedure Laterality Date   BREAST IMPLANT REMOVAL  2014   BUNIONECTOMY Right  2018   PLACEMENT OF BREAST IMPLANTS Bilateral 2010   ROBOTIC ASSISTED SALPINGO OOPHERECTOMY Bilateral 11/11/2019   Procedure: XI ROBOTIC ASSISTED SALPINGO OOPHORECTOMY;  Surgeon: Silverio Lay, MD;  Location: [facility];  Service: Gynecology;  Laterality: Bilateral;   WISDOM TOOTH EXTRACTION     WRIST GANGLION EXCISION Left 12-23-2018       Current Medications: Current Meds  Medication Sig   amLODipine (NORVASC) 2.5 MG tablet Take 1 tablet (2.5 mg total) by mouth daily.   Cholecalciferol (VITAMIN D-3 PO) Take by mouth daily.   levothyroxine (SYNTHROID) 112 MCG tablet Take 112 mcg by mouth daily before breakfast.   losartan (COZAAR) 100 MG tablet Take 100 mg by mouth daily.   [DISCONTINUED] losartan-hydrochlorothiazide (HYZAAR) 50-12.5 MG tablet Take 1 tablet by mouth daily.    Allergies:   Penicillins, Bee venom, Elemental sulfur, and Sulfa antibiotics   Social History   Socioeconomic History   Marital status: Married    Spouse name: Not on file   Number of children: Not on file   Years of education: Not on file   Highest education level: Not on file  Occupational History   Not on file  Tobacco Use   Smoking status: Never   Smokeless tobacco: Never  Vaping Use   Vaping Use:  Never used  Substance and Sexual Activity   Alcohol use: Yes    Comment: occasional   Drug use: Never   Sexual activity: Yes    Birth control/protection: None  Other Topics Concern   Not on file  Social History Narrative   Not on file   Social Determinants of Health   Financial Resource Strain: Not on file  Food Insecurity: Not on file  Transportation Needs: Not on file  Physical Activity: Not on file  Stress: Not on file  Social Connections: Not on file     Family History:  The patient's family history includes Breast cancer (age of onset: 69) in her cousin; Breast cancer (age of onset: 43) in an other family member; Melanoma (age of onset: 65) in her brother; Ovarian  cancer (age of onset: 13) in her mother; Prostate cancer (age of onset: 38) in her maternal uncle; Prostate cancer (age of onset: 70) in her maternal uncle; Prostate cancer (age of onset: 53) in her father.   ROS:   Please see the history of present illness.    ROS All other systems reviewed and are negative.  No flowsheet data found.     PHYSICAL EXAM:   VS:  BP (!) 126/92    Pulse 100    Ht 5\' 6"  (1.676 m)    Wt 136 lb (61.7 kg)    SpO2 96%    BMI 21.95 kg/m    GEN: Well nourished, well developed, in no acute distress  HEENT: normal  Neck: no JVD, carotid bruits, or masses Cardiac: RRR; no murmurs, rubs, or gallops,no edema.  Intact distal pulses bilaterally.  Respiratory:  clear to auscultation bilaterally, normal work of breathing GI: soft, nontender, nondistended, + BS MS: no deformity or atrophy  Skin: warm and dry, no rash Neuro:  Alert and Oriented x 3, Strength and sensation are intact Psych: euthymic mood, full affect  Wt Readings from Last 3 Encounters:  05/05/21 136 lb (61.7 kg)  11/11/19 135 lb 8 oz (61.5 kg)  02/09/12 132 lb (59.9 kg)      Studies/Labs Reviewed:   EKG:  EKG is ordered today.  The ekg ordered today demonstrates NSR with no ST changes  Recent Labs: No results found for requested labs within last 8760 hours.   Lipid Panel No results found for: CHOL, TRIG, HDL, CHOLHDL, VLDL, LDLCALC, LDLDIRECT  Additional studies/ records that were reviewed today include:  None    ASSESSMENT:    1. Benign essential HTN   2. Pure hypercholesterolemia      PLAN:  In order of problems listed above:  Hypertension -BP borderline controlled on exam today -continue prescription drug management with Losartan 100mg  daily -Her diuretic was stopped due to hypercalcemia -start amlodipine 2.5mg  daily -I have asked her to check BP daily for a week and call with results  2.  Hyperlipidemia -Her HDL was good at 76 but LDL was 158 -I would like to see her  LDL at least below 130 and ultimately less than 100 -I have recommended a coronary Ca score to assess future cardiac risk -If she has coronary calcium we will need to place on statin therapy  If BPs are controlled when she calls me back in a week I will see her back in 3 to 4 months   Medication Adjustments/Labs and Tests Ordered: Current medicines are reviewed at length with the patient today.  Concerns regarding medicines are outlined above.  Medication changes, Labs and Tests  ordered today are listed in the Patient Instructions below.  Patient Instructions  Medication Instructions:  1) START Amlodipine 2.5mg  once daily.  Monitor your blood pressure once a day, at least 2 hours after medication, for a week and then call or send a MyChart message with those readings.   *If you need a refill on your cardiac medications before your next appointment, please call your pharmacy*   Lab Work: None If you have labs (blood work) drawn today and your tests are completely normal, you will receive your results only by: MyChart Message (if you have MyChart) OR A paper copy in the mail If you have any lab test that is abnormal or we need to change your treatment, we will call you to review the results.   Testing/Procedures: Your physician recommends that you have a Calcium Score performed.   Follow-Up: At [facility], you and your health needs are our priority.  As part of our continuing mission to provide you with exceptional heart care, we have created designated Provider Care Teams.  These Care Teams include your primary Cardiologist (physician) and Advanced Practice Providers (APPs -  Physician Assistants and Nurse Practitioners) who all work together to provide you with the care you need, when you need it.  We recommend signing up for the patient portal called "MyChart".  Sign up information is provided on this After Visit Summary.  MyChart is used to connect with patients for Virtual  Visits (Telemedicine).  Patients are able to view lab/test results, encounter notes, upcoming appointments, etc.  Non-urgent messages can be sent to your provider as well.   To learn more about what you can do with MyChart, go to [email or web address].    Your next appointment:   3 month(s)  The format for your next appointment:   In Person  Provider:   Armanda Magicraci Saher Davee, MD     Other Instructions  Heart-Healthy Eating Plan Heart-healthy meal planning includes: Eating less unhealthy fats. Eating more healthy fats. Making other changes in your diet. Talk with your doctor or a diet specialist (dietitian) to create an eating plan that is right for you. What is my plan? Your doctor may recommend an eating plan that includes: Total fat: ______% or less of total calories a day. Saturated fat: ______% or less of total calories a day. Cholesterol: less than _________mg a day. What are tips for following this plan? Cooking Avoid frying your food. Try to bake, boil, grill, or broil it instead. You can also reduce fat by: Removing the skin from poultry. Removing all visible fats from meats. Steaming vegetables in water or broth. Meal planning  At meals, divide your plate into four equal parts: Fill one-half of your plate with vegetables and green salads. Fill one-fourth of your plate with whole grains. Fill one-fourth of your plate with lean protein foods. Eat 4-5 servings of vegetables per day. A serving of vegetables is: 1 cup of raw or cooked vegetables. 2 cups of raw leafy greens. Eat 4-5 servings of fruit per day. A serving of fruit is: 1 medium whole fruit.  cup of dried fruit.  cup of fresh, frozen, or canned fruit.  cup of 100% fruit juice. Eat more foods that have soluble fiber. These are apples, broccoli, carrots, beans, peas, and barley. Try to get 20-30 g of fiber per day. Eat 4-5 servings of nuts, legumes, and seeds per week: 1 serving of dried beans or legumes  equals  cup after being cooked. 1 serving of  nuts is  cup. 1 serving of seeds equals 1 tablespoon. General information Eat more home-cooked food. Eat less restaurant, buffet, and fast food. Limit or avoid alcohol. Limit foods that are high in starch and sugar. Avoid fried foods. Lose weight if you are overweight. Keep track of how much salt (sodium) you eat. This is important if you have high blood pressure. Ask your doctor to tell you more about this. Try to add vegetarian meals each week. Fats Choose healthy fats. These include olive oil and canola oil, flaxseeds, walnuts, almonds, and seeds. Eat more omega-3 fats. These include salmon, mackerel, sardines, tuna, flaxseed oil, and ground flaxseeds. Try to eat fish at least 2 times each week. Check food labels. Avoid foods with trans fats or high amounts of saturated fat. Limit saturated fats. These are often found in animal products, such as meats, butter, and cream. These are also found in plant foods, such as palm oil, palm kernel oil, and coconut oil. Avoid foods with partially hydrogenated oils in them. These have trans fats. Examples are stick margarine, some tub margarines, cookies, crackers, and other baked goods. What foods can I eat? Fruits All fresh, canned (in natural juice), or frozen fruits. Vegetables Fresh or frozen vegetables (raw, steamed, roasted, or grilled). Green salads. Grains Most grains. Choose whole wheat and whole grains most of the time. Rice and pasta, including brown rice and pastas made with whole wheat. Meats and other proteins Lean, well-trimmed beef, veal, pork, and lamb. Chicken and [place] without skin. All fish and shellfish. Wild duck, rabbit, pheasant, and venison. Egg whites or low-cholesterol egg substitutes. Dried beans, peas, lentils, and tofu. Seeds and most nuts. Dairy Low-fat or nonfat cheeses, including ricotta and mozzarella. Skim or 1% milk that is liquid, powdered, or evaporated.  Buttermilk that is made with low-fat milk. Nonfat or low-fat yogurt. Fats and oils Non-hydrogenated (trans-free) margarines. Vegetable oils, including soybean, sesame, sunflower, olive, peanut, safflower, corn, canola, and cottonseed. Salad dressings or mayonnaise made with a vegetable oil. Beverages Mineral water. Coffee and tea. Diet carbonated beverages. Sweets and desserts Sherbet, gelatin, and fruit ice. Small amounts of dark chocolate. Limit all sweets and desserts. Seasonings and condiments All seasonings and condiments. The items listed above may not be a complete list of foods and drinks you can eat. Contact a dietitian for more options. What foods should I avoid? Fruits Canned fruit in heavy syrup. Fruit in cream or butter sauce. Fried fruit. Limit coconut. Vegetables Vegetables cooked in cheese, cream, or butter sauce. Fried vegetables. Grains Breads that are made with saturated or trans fats, oils, or whole milk. Croissants. Sweet rolls. Donuts. High-fat crackers, such as cheese crackers. Meats and other proteins Fatty meats, such as hot dogs, ribs, sausage, bacon, rib-eye roast or steak. High-fat deli meats, such as salami and bologna. Caviar. Domestic duck and goose. Organ meats, such as liver. Dairy Cream, sour cream, cream cheese, and creamed cottage cheese. Whole-milk cheeses. Whole or 2% milk that is liquid, evaporated, or condensed. Whole buttermilk. Cream sauce or high-fat cheese sauce. Yogurt that is made from whole milk. Fats and oils Meat fat, or shortening. Cocoa butter, hydrogenated oils, palm oil, coconut oil, palm kernel oil. Solid fats and shortenings, including bacon fat, salt pork, lard, and butter. Nondairy cream substitutes. Salad dressings with cheese or sour cream. Beverages Regular sodas and juice drinks with added sugar. Sweets and desserts Frosting. Pudding. Cookies. Cakes. Pies. Milk chocolate or white chocolate. Buttered syrups. Full-fat ice cream or  ice cream drinks. The items listed above may not be a complete list of foods and drinks to avoid. Contact a dietitian for more information. Summary Heart-healthy meal planning includes eating less unhealthy fats, eating more healthy fats, and making other changes in your diet. Eat a balanced diet. This includes fruits and vegetables, low-fat or nonfat dairy, lean protein, nuts and legumes, whole grains, and heart-healthy oils and fats. This information is not intended to replace advice given to you by your health care provider. Make sure you discuss any questions you have with your health care provider. Document Revised: 06/24/2020 Document Reviewed: 06/24/2020 Elsevier Patient Education  [address].     Signed, Armanda Magic, MD  05/06/2021 2:16 PM    [facility] [address] [place], [place], [place]  [address] Phone: [phone]; Fax: [phone]

## 2021-05-18 ENCOUNTER — Encounter: Payer: Self-pay | Admitting: Cardiology

## 2021-05-18 MED ORDER — AMLODIPINE BESYLATE 5 MG PO TABS: 5.0000 mg | ORAL_TABLET | Freq: Every day | ORAL | 3 refills | Status: DC

## 2021-06-14 ENCOUNTER — Ambulatory Visit (INDEPENDENT_AMBULATORY_CARE_PROVIDER_SITE_OTHER)
Admission: RE | Admit: 2021-06-14 | Discharge: 2021-06-14 | Disposition: A | Discharge status: Home / Self Care | Payer: Self-pay | Source: Ambulatory Visit | Attending: Cardiology | Admitting: Cardiology

## 2021-06-14 DIAGNOSIS — I10 Essential (primary) hypertension: Secondary | ICD-10-CM

## 2021-08-08 ENCOUNTER — Encounter: Payer: Self-pay | Admitting: Cardiology

## 2021-09-07 ENCOUNTER — Ambulatory Visit: Payer: 59 | Admitting: Cardiology

## 2021-10-27 ENCOUNTER — Ambulatory Visit: Payer: 59 | Attending: Cardiology | Admitting: Cardiology

## 2021-10-27 ENCOUNTER — Encounter: Payer: Self-pay | Admitting: Cardiology

## 2021-10-27 ENCOUNTER — Other Ambulatory Visit: Payer: Self-pay | Admitting: Cardiology

## 2021-10-27 ENCOUNTER — Ambulatory Visit (INDEPENDENT_AMBULATORY_CARE_PROVIDER_SITE_OTHER): Payer: 59

## 2021-10-27 VITALS — BP 118/72 | HR 100 | Ht 66.0 in | Wt 134.4 lb

## 2021-10-27 DIAGNOSIS — R000 Tachycardia, unspecified: Secondary | ICD-10-CM | POA: Diagnosis not present

## 2021-10-27 DIAGNOSIS — I10 Essential (primary) hypertension: Secondary | ICD-10-CM

## 2021-10-27 DIAGNOSIS — E7800 Pure hypercholesterolemia, unspecified: Secondary | ICD-10-CM

## 2021-10-27 MED ORDER — AMLODIPINE BESYLATE 5 MG PO TABS: 5.0000 mg | ORAL_TABLET | Freq: Every day | ORAL | 3 refills | Status: DC

## 2021-10-27 NOTE — Patient Instructions (Signed)
Medication Instructions:  Your physician recommends that you continue on your current medications as directed. Please refer to the Current Medication list given to you today.  *If you need a refill on your cardiac medications before your next appointment, please call your pharmacy*  Lab Work: TODAY: FLP, Lpa, ApoB If you have labs (blood work) drawn today and your tests are completely normal, you will receive your results only by: MyChart Message (if you have MyChart) OR A paper copy in the mail If you have any lab test that is abnormal or we need to change your treatment, we will call you to review the results.  Testing/Procedures: Your physician has recommended that you wear an event monitor. Event monitors are medical devices that record the heart's electrical activity. Doctors most often [place] these monitors to diagnose arrhythmias. Arrhythmias are problems with the speed or rhythm of the heartbeat. The monitor is a small, portable device. You can wear one while you do your normal daily activities. This is usually used to diagnose what is causing palpitations/syncope (passing out).  Follow-Up: At [facility], you and your health needs are our priority.  As part of our continuing mission to provide you with exceptional heart care, we have created designated Provider Care Teams.  These Care Teams include your primary Cardiologist (physician) and Advanced Practice Providers (APPs -  Physician Assistants and Nurse Practitioners) who all work together to provide you with the care you need, when you need it.   Your next appointment:    1 year  The format for your next appointment:   In Person  Provider:   Armanda Magic, MD     Other Instructions ZIO XT- Long Term Monitor Instructions  Your physician has requested you wear a ZIO patch monitor for 3 days.  This is a single patch monitor. [employer] supplies one patch monitor per enrollment. Additional stickers are not available.  Please do not apply patch if you will be having a Nuclear Stress Test,  Echocardiogram, Cardiac CT, MRI, or Chest Xray during the period you would be wearing the  monitor. The patch cannot be worn during these tests. You cannot remove and re-apply the  ZIO XT patch monitor.  Your ZIO patch monitor will be mailed 3 day USPS to your address on file. It may take 3-5 days  to receive your monitor after you have been enrolled.  Once you have received your monitor, please review the enclosed instructions. Your monitor  has already been registered assigning a specific monitor serial # to you.  Billing and Patient Assistance Program Information  We have supplied [employer] with any of your insurance information on file for billing purposes. [employer] offers a sliding scale Patient Assistance Program for patients that do not have  insurance, or whose insurance does not completely cover the cost of the ZIO monitor.  You must apply for the Patient Assistance Program to qualify for this discounted rate.  To apply, please call [employer] at [phone], select option 4, select option 2, ask to apply for  Patient Assistance Program. Meredeth Ide will ask your household income, and how many people  are in your household. They will quote your out-of-pocket cost based on that information.  [employer] will also be able to set up a 46-month, interest-free payment plan if needed.  Applying the monitor   Shave hair from upper left chest.  Hold abrader disc by orange tab. Rub abrader in 40 strokes over the upper left chest as  indicated in  your monitor instructions.  Clean area with 4 enclosed alcohol pads. Let dry.  Apply patch as indicated in monitor instructions. Patch will be placed under collarbone on left  side of chest with arrow pointing upward.  Rub patch adhesive wings for 2 minutes. Remove white label marked "1". Remove the white  label marked "2". Rub patch adhesive wings for 2 additional minutes.  While  looking in a mirror, press and release button in center of patch. A small green light will  flash 3-4 times. This will be your only indicator that the monitor has been turned on.  Do not shower for the first 24 hours. You may shower after the first 24 hours.  Press the button if you feel a symptom. You will hear a small click. Record Date, Time and  Symptom in the Patient Logbook.  When you are ready to remove the patch, follow instructions on the last 2 pages of Patient  Logbook. Stick patch monitor onto the last page of Patient Logbook.  Place Patient Logbook in the blue and white box. Use locking tab on box and tape box closed  securely. The blue and white box has prepaid postage on it. Please place it in the mailbox as  soon as possible. Your physician should have your test results approximately 7 days after the  monitor has been mailed back to [facility].  Call [facility] Customer Care at [phone] if you have questions regarding  your ZIO XT patch monitor. Call them immediately if you see an orange light blinking on your  monitor.  If your monitor falls off in less than 4 days, contact our Monitor department at [phone].  If your monitor becomes loose or falls off after 4 days call [employer] at [phone] for  suggestions on securing your monitor   Important Information About Sugar

## 2021-10-27 NOTE — Progress Notes (Addendum)
Cardiology office note    Date:  10/27/2021   ID:  Tessy, Pawelski 07/12/1965, MRN [id number]  PCP:  Silverio Lay, MD  Cardiologist:  Armanda Magic, MD   Chief Complaint  Patient presents with   Hypertension   Hyperlipidemia    History of Present Illness:  Renee Keith is a 56 y.o. female with a history of hypertension for several years followed by her PCP.  She has a very strong family history of cancer.  Her mom died of ovarian cancer at 69.  She is unaware of any cardiac history in first-degree relatives but a grandparent on her mom side had coronary disease.  She has never smoked.  She has been maintained on losartan HCT initially but then developed hypercalcemia and the diuretic was stopped and her losartan was increased to 100 mg daily.   In review of notes sent over by Dr. Estanislado Pandy, her cholesterol has also been elevated with a total cholesterol 264, triglycerides 169, HDL 76 and LDL 158.  Her LDL goal is less than 130 and preferably less than 100.  She is postmenopausal.    At her last office visit a coronary calcium score was done which was 0.  Her blood pressure was also elevated still on amlodipine 2.5 mg was added to her losartan.  She is now here for follow-up.  She is here today for followup and is doing well.  She denies any chest pain or pressure, SOB, DOE, PND, orthopnea,  dizziness, palpitations or syncope. She inadvertently was taking 2 tablets of Amlodipine 5mg  daily instead of 1 tablet daily.  Since doing this she has had some LE edema. She is compliant with her meds and is tolerating meds with no SE.     Past Medical History:  Diagnosis Date   Family history of adverse reaction to anesthesia    pt son-- ponv   Family history of breast cancer    Family history of melanoma    Family history of ovarian cancer    Family history of prostate cancer    Hypertension    followed by pcp  (per pt never had a stress test)   Hypothyroidism    followed by pcp   PONV  (postoperative nausea and vomiting)    Wears contact lenses     Past Surgical History:  Procedure Laterality Date   BREAST IMPLANT REMOVAL  2014   BUNIONECTOMY Right 2018   PLACEMENT OF BREAST IMPLANTS Bilateral 2010   ROBOTIC ASSISTED SALPINGO OOPHERECTOMY Bilateral 11/11/2019   Procedure: XI ROBOTIC ASSISTED SALPINGO OOPHORECTOMY;  Surgeon: 11/13/2019, MD;  Location: [facility];  Service: Gynecology;  Laterality: Bilateral;   WISDOM TOOTH EXTRACTION     WRIST GANGLION EXCISION Left 12-23-2018   @[facility]     Current Medications: Current Meds  Medication Sig   amLODipine (NORVASC) 5 MG tablet Take 1 tablet (5 mg total) by mouth daily.   Cholecalciferol (VITAMIN D-3 PO) Take by mouth daily.   EPINEPHrine 0.3 mg/0.3 mL IJ SOAJ injection epinephrine 0.3 mg/0.3 mL injection, auto-injector   levothyroxine (SYNTHROID) 112 MCG tablet Take 112 mcg by mouth daily before breakfast.   losartan (COZAAR) 100 MG tablet Take 100 mg by mouth daily.    Allergies:   Penicillins, Bee venom, Elemental sulfur, and Sulfa antibiotics   Social History   Socioeconomic History   Marital status: Married    Spouse name: Not on file   Number of children: Not on file  Years of education: Not on file   Highest education level: Not on file  Occupational History   Not on file  Tobacco Use   Smoking status: Never   Smokeless tobacco: Never  Vaping Use   Vaping Use: Never used  Substance and Sexual Activity   Alcohol use: Yes    Comment: occasional   Drug use: Never   Sexual activity: Yes    Birth control/protection: None  Other Topics Concern   Not on file  Social History Narrative   Not on file   Social Determinants of Health   Financial Resource Strain: Not on file  Food Insecurity: Not on file  Transportation Needs: Not on file  Physical Activity: Not on file  Stress: Not on file  Social Connections: Not on file     Family History:  The patient's family history  includes Breast cancer (age of onset: 35) in her cousin; Breast cancer (age of onset: 39) in an other family member; Melanoma (age of onset: 48) in her brother; Ovarian cancer (age of onset: 33) in her mother; Prostate cancer (age of onset: 71) in her maternal uncle; Prostate cancer (age of onset: 96) in her maternal uncle; Prostate cancer (age of onset: 53) in her father.   ROS:   Please see the history of present illness.    ROS All other systems reviewed and are negative.      No data to display             PHYSICAL EXAM:   VS:  BP 118/72   Pulse 100   Ht 5\' 6"  (1.676 m)   Wt 134 lb 6.4 oz (61 kg)   SpO2 94%   BMI 21.69 kg/m    GEN: Well nourished, well developed in no acute distress HEENT: Normal NECK: No JVD; No carotid bruits LYMPHATICS: No lymphadenopathy CARDIAC:RRR, no murmurs, rubs, gallops RESPIRATORY:  Clear to auscultation without rales, wheezing or rhonchi  ABDOMEN: Soft, non-tender, non-distended MUSCULOSKELETAL:  No edema; No deformity  SKIN: Warm and dry NEUROLOGIC:  Alert and oriented x 3 PSYCHIATRIC:  Normal affect  Wt Readings from Last 3 Encounters:  10/27/21 134 lb 6.4 oz (61 kg)  05/05/21 136 lb (61.7 kg)  11/11/19 135 lb 8 oz (61.5 kg)      Studies/Labs Reviewed:   EKG:  EKG is not ordered today.    Recent Labs: No results found for requested labs within last 365 days.   Lipid Panel No results found for: "CHOL", "TRIG", "HDL", "CHOLHDL", "VLDL", "LDLCALC", "LDLDIRECT"  Additional studies/ records that were reviewed today include:  Coronary artery calcium score    ASSESSMENT:    1. Benign essential HTN   2. Pure hypercholesterolemia      PLAN:  In order of problems listed above:  Hypertension -Her BP is adequately controlled on exam today -Continue prescription drug management losartan 100mg  daily with as needed refills -I have told her to decrease her amlodipine to 5mg  daily since she had LE edema on the higher dose she was  taking inadvertently and her BP is well controlled -Her diuretic was stopped due to hypercalcemia  2.  Hyperlipidemia -Her HDL was good at 76 but LDL was 158 -Coronary calcium score was 0 -Repeat FLP and ALT as well as Lpa and Apolipoprotein B to see where her LDL is now.  Ultimately I like to see her LDL less than 100  3.  Tachycardia -HR has been in the 100's for the last 2  visitis -she is on Synthroid so will check a TSH -get 48 hour Ziopatch to assess average heart rate>>If > 100 for a significant amount of time may need to back off synthroid dose some     Medication Adjustments/Labs and Tests Ordered: Current medicines are reviewed at length with the patient today.  Concerns regarding medicines are outlined above.  Medication changes, Labs and Tests ordered today are listed in the Patient Instructions below. Signed, Armanda Magic, MD  10/27/2021 9:22 AM    [facility] [address] [place], [place], [place]  [address] Phone: [phone]; Fax: [phone]

## 2021-10-27 NOTE — Addendum Note (Signed)
Addended by: Theresia Majors on: 10/27/2021 09:27 AM   Modules accepted: Orders

## 2021-10-27 NOTE — Progress Notes (Unsigned)
Enrolled for [employer] to mail a ZIO XT long term holter monitor to the patients address on file.  

## 2021-10-28 LAB — LIPID PANEL
Chol/HDL Ratio: 2.9 ratio (ref 0.0–4.4)
Cholesterol, Total: 235 mg/dL — ABNORMAL HIGH (ref 100–199)
HDL: 81 mg/dL (ref >39)
LDL Chol Calc (NIH): 134 mg/dL — ABNORMAL HIGH (ref 0–99)
Triglycerides: 118 mg/dL (ref 0–149)
VLDL Cholesterol Cal: 20 mg/dL (ref 5–40)

## 2021-10-28 LAB — APOLIPOPROTEIN B: Apolipoprotein B: 87 mg/dL (ref <90)

## 2021-10-28 LAB — LIPOPROTEIN A (LPA): Lipoprotein (a): 30.9 nmol/L (ref <75.0)

## 2021-11-18 ENCOUNTER — Encounter: Payer: Self-pay | Admitting: Cardiology

## 2021-11-18 DIAGNOSIS — I10 Essential (primary) hypertension: Secondary | ICD-10-CM

## 2021-11-23 MED ORDER — SPIRONOLACTONE 25 MG PO TABS: 25.0000 mg | ORAL_TABLET | Freq: Every day | ORAL | 3 refills | Status: DC

## 2021-11-23 NOTE — Addendum Note (Signed)
Addended by: Antonieta Iba on: 11/23/2021 03:53 PM   Modules accepted: Orders

## 2021-12-07 ENCOUNTER — Ambulatory Visit: Payer: 59 | Attending: Cardiology

## 2021-12-07 DIAGNOSIS — I10 Essential (primary) hypertension: Secondary | ICD-10-CM

## 2021-12-08 ENCOUNTER — Encounter: Payer: Self-pay | Admitting: Cardiology

## 2021-12-08 DIAGNOSIS — R000 Tachycardia, unspecified: Secondary | ICD-10-CM

## 2021-12-08 DIAGNOSIS — I10 Essential (primary) hypertension: Secondary | ICD-10-CM

## 2021-12-08 LAB — BASIC METABOLIC PANEL
BUN/Creatinine Ratio: 17 (ref 9–23)
BUN: 21 mg/dL (ref 6–24)
CO2: 22 mmol/L (ref 20–29)
Calcium: 11.6 mg/dL — ABNORMAL HIGH (ref 8.7–10.2)
Chloride: 95 mmol/L — ABNORMAL LOW (ref 96–106)
Creatinine, Ser: 1.27 mg/dL — ABNORMAL HIGH (ref 0.57–1.00)
Glucose: 110 mg/dL — ABNORMAL HIGH (ref 70–99)
Potassium: 4.5 mmol/L (ref 3.5–5.2)
Sodium: 136 mmol/L (ref 134–144)
eGFR: 49 mL/min/{1.73_m2} — ABNORMAL LOW (ref >59)

## 2021-12-08 MED ORDER — METOPROLOL SUCCINATE ER 25 MG PO TB24: 12.5000 mg | ORAL_TABLET | Freq: Every day | ORAL | 3 refills | Status: DC

## 2021-12-19 ENCOUNTER — Telehealth: Payer: Self-pay

## 2021-12-19 ENCOUNTER — Ambulatory Visit: Payer: 59 | Attending: Cardiology

## 2021-12-19 DIAGNOSIS — I10 Essential (primary) hypertension: Secondary | ICD-10-CM

## 2021-12-19 DIAGNOSIS — Z79899 Other long term (current) drug therapy: Secondary | ICD-10-CM

## 2021-12-19 NOTE — Telephone Encounter (Signed)
BMET order put in for the lab.

## 2021-12-20 LAB — BASIC METABOLIC PANEL
BUN/Creatinine Ratio: 15 (ref 9–23)
BUN: 17 mg/dL (ref 6–24)
CO2: 22 mmol/L (ref 20–29)
Calcium: 10.8 mg/dL — ABNORMAL HIGH (ref 8.7–10.2)
Chloride: 94 mmol/L — ABNORMAL LOW (ref 96–106)
Creatinine, Ser: 1.11 mg/dL — ABNORMAL HIGH (ref 0.57–1.00)
Glucose: 92 mg/dL (ref 70–99)
Potassium: 4 mmol/L (ref 3.5–5.2)
Sodium: 135 mmol/L (ref 134–144)
eGFR: 58 mL/min/{1.73_m2} — ABNORMAL LOW (ref >59)

## 2021-12-21 ENCOUNTER — Telehealth: Payer: Self-pay

## 2021-12-21 ENCOUNTER — Other Ambulatory Visit: Payer: Self-pay

## 2021-12-21 ENCOUNTER — Encounter: Payer: Self-pay | Admitting: Cardiology

## 2021-12-21 DIAGNOSIS — E7800 Pure hypercholesterolemia, unspecified: Secondary | ICD-10-CM

## 2021-12-21 DIAGNOSIS — Z79899 Other long term (current) drug therapy: Secondary | ICD-10-CM

## 2021-12-21 DIAGNOSIS — R000 Tachycardia, unspecified: Secondary | ICD-10-CM

## 2021-12-21 DIAGNOSIS — I10 Essential (primary) hypertension: Secondary | ICD-10-CM

## 2021-12-21 DIAGNOSIS — Z8041 Family history of malignant neoplasm of ovary: Secondary | ICD-10-CM

## 2021-12-21 MED ORDER — METOPROLOL SUCCINATE ER 25 MG PO TB24: 25.0000 mg | ORAL_TABLET | Freq: Every day | ORAL | 3 refills | Status: DC

## 2021-12-21 MED ORDER — SPIRONOLACTONE 25 MG PO TABS: 25.0000 mg | ORAL_TABLET | Freq: Every day | ORAL | 3 refills | Status: DC

## 2021-12-21 NOTE — Telephone Encounter (Signed)
See MyChart message

## 2021-12-21 NOTE — Telephone Encounter (Signed)
-----   Message from Sueanne Margarita, MD sent at 12/20/2021  5:37 PM EDT ----- Discussed lab results with patient.  She just got back from a trip to [place].  She did stop her spironolactone a week ago and so this new lab is representative of being off the diuretic for a week.  Her serum creatinine still is slightly elevated.  She says she tried to drink some increased fluids but was not real good on the plane flight back suspect she could be slightly dehydrated.  I have encouraged her to increase her fluids.  In regards to the calcium being elevated I would imagine that this should be normalized at this point.  I did restart spironolactone which is not known to significant hypercalcemia.   I would like her to come in for further blood work including an ionized calcium; PTH; PTHrP; 25 (OH) vitamin D; 1,25 (OH)2 vitamin D and TSH level as well as UA with urinary Ca level.  Repeat BMET in 1 week.  Also need followup with me in 4 weeks to followup on atrial tach on BB

## 2022-01-04 ENCOUNTER — Encounter: Payer: Self-pay | Admitting: Cardiology

## 2022-01-06 ENCOUNTER — Telehealth: Payer: Self-pay

## 2022-01-06 DIAGNOSIS — Z79899 Other long term (current) drug therapy: Secondary | ICD-10-CM

## 2022-01-06 DIAGNOSIS — I10 Essential (primary) hypertension: Secondary | ICD-10-CM

## 2022-01-06 DIAGNOSIS — R7989 Other specified abnormal findings of blood chemistry: Secondary | ICD-10-CM

## 2022-01-06 NOTE — Telephone Encounter (Signed)
-----   Message from Quintella Reichert, MD sent at 01/06/2022 10:14 AM EST ----- Abnormal labs discussed with Julee.  Ionized Ca elevated despite stopping diuretic and SCr mildly elevated and may be related to ARB. Please let her know that after review of labs and her Bp issues, I would like her to have renal duplex done to rule out renal artery stenosis especially in light of elevated SCr on ARB

## 2022-01-06 NOTE — Telephone Encounter (Signed)
Renee Reichert, MD     I talked with [name] this am and she says that she is taking the Losartan and Toprol in the am and in the afternoon she will have some dizziness and feel "off".  I recommended that she take the Toprol in the evening and Losartan in the am.    I verified the meds she is taking which include Losartan 100mg  daily and Toprol XL 25mg  daily along with Synthroid.  She is not taking Spironolactone so please remove from med list.  We discussed her labs with her persistently elevated Ca level and ionized Ca level despite being off diuretics.  Urine Ca, PTH like hormone and VIT d levels are still pending.  I would like her to be referred to Dr. with endocrinology for futher workup.  Encouraged her to try to stay hydrated.  Her sCr is slightly elevated which may be related to Losartan but Bp meds are limited due to LE edema with amlodipine and inability to use diuretics due to elevated Ca level.    Orders placed for renal ultrasound. Referral placed for endocrinology. Spironolactone has been removed from her list.

## 2022-01-11 ENCOUNTER — Encounter: Payer: Self-pay | Admitting: Cardiology

## 2022-01-13 LAB — BASIC METABOLIC PANEL
BUN/Creatinine Ratio: 14 (ref 9–23)
BUN: 18 mg/dL (ref 6–24)
CO2: 23 mmol/L (ref 20–29)
Calcium: 11.5 mg/dL — ABNORMAL HIGH (ref 8.7–10.2)
Chloride: 91 mmol/L — ABNORMAL LOW (ref 96–106)
Creatinine, Ser: 1.26 mg/dL — ABNORMAL HIGH (ref 0.57–1.00)
Glucose: 93 mg/dL (ref 70–99)
Potassium: 4.7 mmol/L (ref 3.5–5.2)
Sodium: 130 mmol/L — ABNORMAL LOW (ref 134–144)
eGFR: 50 mL/min/{1.73_m2} — ABNORMAL LOW (ref >59)

## 2022-01-13 LAB — TSH: TSH: 1.54 u[IU]/mL (ref 0.450–4.500)

## 2022-01-13 LAB — URINALYSIS, ROUTINE W REFLEX MICROSCOPIC
Bilirubin, UA: NEGATIVE
Glucose, UA: NEGATIVE
Ketones, UA: NEGATIVE
Leukocytes,UA: NEGATIVE
Nitrite, UA: NEGATIVE
Protein,UA: NEGATIVE
RBC, UA: NEGATIVE
Specific Gravity, UA: 1.006 (ref 1.005–1.030)
Urobilinogen, Ur: 0.2 mg/dL (ref 0.2–1.0)
pH, UA: 6 (ref 5.0–7.5)

## 2022-01-13 LAB — VITAMIN D 25 HYDROXY (VIT D DEFICIENCY, FRACTURES): Vit D, 25-Hydroxy: 44.6 ng/mL (ref 30.0–100.0)

## 2022-01-13 LAB — CALCIUM, URINE, RANDOM: Calcium, Urine: 1.7 mg/dL

## 2022-01-13 LAB — VITAMIN D 1,25 DIHYDROXY
Vitamin D 1, 25 (OH)2 Total: 22 pg/mL
Vitamin D2 1, 25 (OH)2: <10 pg/mL
Vitamin D3 1, 25 (OH)2: 21 pg/mL

## 2022-01-13 LAB — PTH, INTACT AND CALCIUM: PTH: 20 pg/mL (ref 15–65)

## 2022-01-13 LAB — CALCIUM, IONIZED: Calcium, Ion: 5.9 mg/dL — ABNORMAL HIGH (ref 4.5–5.6)

## 2022-01-13 LAB — PTH-RELATED PEPTIDE: PTH-related peptide: <2 pmol/L

## 2022-01-16 ENCOUNTER — Telehealth: Payer: Self-pay | Admitting: Cardiology

## 2022-01-16 MED ORDER — METOPROLOL SUCCINATE ER 25 MG PO TB24: 37.5000 mg | ORAL_TABLET | Freq: Every day | ORAL | 3 refills | Status: DC

## 2022-01-16 NOTE — Telephone Encounter (Signed)
[employer] states that their printer ran out of ink before the referral was finished and would like for this to be faxed back over for this patient.   Fax # [phone]

## 2022-01-22 NOTE — Progress Notes (Unsigned)
Cardiology office note    Date:  01/23/2022   ID:  Renee Keith, Renee Keith [birth year], MRN [id number]  PCP:  Renee Lay, MD  Cardiologist:  Renee Magic, MD   Chief Complaint  Patient presents with   Follow-up    HTN, HLD, PAT, hypercalcemia    History of Present Illness:  Renee Keith is a 56 y.o. female with a history of hypertension for several years followed by her PCP.  She has a very strong family history of cancer.  Her mom died of ovarian cancer at 49.  She is unaware of any cardiac history in first-degree relatives but a grandparent on her mom side had coronary disease.  She has never smoked.  She has been maintained on losartan HCT initially but then developed hypercalcemia and the diuretic was stopped and her losartan was increased to 100 mg daily.   Her cholesterol has also been elevated with a total cholesterol 264, triglycerides 169, HDL 76 and LDL 158.  Her LDL goal is less than 130 and preferably less than 100.  She is postmenopausal.    Coronary calcium score was done which was 0.  Her blood pressure has been elevated despite Losartan and Amlodipine was added but she developed LE edema.  Her amlodipine was stopped and she was started on Aldactone but hypercalcemia reoccurred and diuretics was stopped.  Her Losartan was increased.   A ziopatch was ordered due to increased resting HR and this showed PAT up to 6 min in duration as well as PVCs.  She was started on Toprol XL 25mg  daily but had fatigue and some dizziness so her Toprol was moved to nightly instead of in the am.  Repeat Calcium off of diuretics remained elevated > 11.  Workup for hypercalcemia was negative.  SHe has been referred to endocrinology.    She is here today for followup and is doing well.  She denies any chest pain or pressure, SOB, DOE, PND, orthopnea, LE edema, dizziness, palpitations or syncope. She is compliant with her meds and is tolerating meds with no SE.    Past Medical History:  Diagnosis Date    Family history of adverse reaction to anesthesia    pt son-- ponv   Family history of breast cancer    Family history of melanoma    Family history of ovarian cancer    Family history of prostate cancer    Hypertension    followed by pcp  (per pt never had a stress test)   Hypothyroidism    followed by pcp   PONV (postoperative nausea and vomiting)    Wears contact lenses     Past Surgical History:  Procedure Laterality Date   BREAST IMPLANT REMOVAL  2014   BUNIONECTOMY Right 2018   PLACEMENT OF BREAST IMPLANTS Bilateral 2010   ROBOTIC ASSISTED SALPINGO OOPHERECTOMY Bilateral 11/11/2019   Procedure: XI ROBOTIC ASSISTED SALPINGO OOPHORECTOMY;  Surgeon: 11/13/2019, MD;  Location: [facility];  Service: Gynecology;  Laterality: Bilateral;   WISDOM TOOTH EXTRACTION     WRIST GANGLION EXCISION Left 12-23-2018   @[facility]     Current Medications: Current Meds  Medication Sig   Cholecalciferol (VITAMIN D-3 PO) Take by mouth daily.   levothyroxine (SYNTHROID) 112 MCG tablet Take 112 mcg by mouth daily before breakfast.   losartan (COZAAR) 100 MG tablet Take 100 mg by mouth daily.   metoprolol succinate (TOPROL XL) 25 MG 24 hr tablet Take 1.5 tablets (37.5 mg total)  by mouth daily.    Allergies:   Penicillins, Bee venom, Elemental sulfur, and Sulfa antibiotics   Social History   Socioeconomic History   Marital status: Married    Spouse name: Not on file   Number of children: Not on file   Years of education: Not on file   Highest education level: Not on file  Occupational History   Not on file  Tobacco Use   Smoking status: Never   Smokeless tobacco: Never  Vaping Use   Vaping Use: Never used  Substance and Sexual Activity   Alcohol use: Yes    Comment: occasional   Drug use: Never   Sexual activity: Yes    Birth control/protection: None  Other Topics Concern   Not on file  Social History Narrative   Not on file   Social Determinants of Health    Financial Resource Strain: Not on file  Food Insecurity: Not on file  Transportation Needs: Not on file  Physical Activity: Not on file  Stress: Not on file  Social Connections: Not on file     Family History:  The patient's family history includes Breast cancer (age of onset: 36) in her cousin; Breast cancer (age of onset: 71) in an other family member; Melanoma (age of onset: 53) in her brother; Ovarian cancer (age of onset: 8) in her mother; Prostate cancer (age of onset: 32) in her maternal uncle; Prostate cancer (age of onset: 22) in her maternal uncle; Prostate cancer (age of onset: 53) in her father.   ROS:   Please see the history of present illness.    ROS All other systems reviewed and are negative.      No data to display             PHYSICAL EXAM:   VS:  BP (!) 134/98   Pulse 100   Ht 5\' 6"  (1.676 m)   Wt 134 lb (60.8 kg)   BMI 21.63 kg/m    GEN: Well nourished, well developed in no acute distress HEENT: Normal NECK: No JVD; No carotid bruits LYMPHATICS: No lymphadenopathy CARDIAC:RRR, no murmurs, rubs, gallops RESPIRATORY:  Clear to auscultation without rales, wheezing or rhonchi  ABDOMEN: Soft, non-tender, non-distended MUSCULOSKELETAL:  No edema; No deformity  SKIN: Warm and dry NEUROLOGIC:  Alert and oriented x 3 PSYCHIATRIC:  Normal affect  Wt Readings from Last 3 Encounters:  01/23/22 134 lb (60.8 kg)  10/27/21 134 lb 6.4 oz (61 kg)  05/05/21 136 lb (61.7 kg)      Studies/Labs Reviewed:   EKG:  EKG is not ordered today.    Recent Labs: 01/04/2022: BUN 18; Creatinine, Ser 1.26; Potassium 4.7; Sodium 130; TSH 1.540   Lipid Panel    Component Value Date/Time   CHOL 235 (H) 10/27/2021 0941   TRIG 118 10/27/2021 0941   HDL 81 10/27/2021 0941   CHOLHDL 2.9 10/27/2021 0941   LDLCALC 134 (H) 10/27/2021 0941    Additional studies/ records that were reviewed today include:  Coronary artery calcium score    ASSESSMENT:    1. Benign  essential HTN   2. Pure hypercholesterolemia   3. Tachycardia, unspecified   4. Elevated serum creatinine      PLAN:  In order of problems listed above:  Hypertension -Her BP is adequately controlled on exam today -continue prescription drug management with Losartan 100mg  daily and increase Toprol XL to 50mg  daily with PRN refills -Her diuretic was stopped due to hypercalcemia -intolerant to  amlodipine due to LE edema -check BP and HR for a week and call with results  2.  Hyperlipidemia -Her HDL was good at 76 but LDL was 158 -Coronary calcium score was 0 -labs 09/2021 showed LDL 134, HDL 81 and Lp(a) was normal at 87 -would like to see her LDL < 100 -encouraged her to work on diet and exercise and will recheck an NMR panel in 4 months  3.  Tachycardia -HR has been in the 100's for the last 2 visitis -she is on Synthroid and TSH was normal at 1.54 on 01/04/2022 -Ziopatch showed 2 runs of PAT as high as 193bpm with longest run lasting 6 min and 19 sec and average heart rate 96bpm -she is now on Toprol -continue prescription drug management but increasing  toprol XL to 50mg  daily with PRN refills  4.  Hypercalcemia -workup normal -referred to Endocrine   Medication Adjustments/Labs and Tests Ordered: Current medicines are reviewed at length with the patient today.  Concerns regarding medicines are outlined above.  Medication changes, Labs and Tests ordered today are listed in the Patient Instructions below. Signed, , MD  01/23/2022 10:35 AM    [facility] [address] [place], [place], [place]  [place] Phone: [phone]; Fax: [phone]

## 2022-01-23 ENCOUNTER — Encounter: Payer: Self-pay | Admitting: Cardiology

## 2022-01-23 ENCOUNTER — Ambulatory Visit: Payer: 59 | Attending: Cardiology | Admitting: Cardiology

## 2022-01-23 VITALS — BP 134/98 | HR 100 | Ht 66.0 in | Wt 134.0 lb

## 2022-01-23 DIAGNOSIS — R000 Tachycardia, unspecified: Secondary | ICD-10-CM

## 2022-01-23 DIAGNOSIS — E7800 Pure hypercholesterolemia, unspecified: Secondary | ICD-10-CM | POA: Diagnosis not present

## 2022-01-23 DIAGNOSIS — I10 Essential (primary) hypertension: Secondary | ICD-10-CM | POA: Diagnosis not present

## 2022-01-23 DIAGNOSIS — R7989 Other specified abnormal findings of blood chemistry: Secondary | ICD-10-CM | POA: Diagnosis not present

## 2022-01-23 MED ORDER — METOPROLOL SUCCINATE ER 50 MG PO TB24: 50.0000 mg | ORAL_TABLET | Freq: Every day | ORAL | 3 refills | Status: DC

## 2022-01-23 NOTE — Patient Instructions (Signed)
Medication Instructions:  Your physician has recommended you make the following change in your medication:   Increase metoprolol XL to 50mg  daily at bedtime   *If you need a refill on your cardiac medications before your next appointment, please call your pharmacy*   Lab Work: [facility] in 4 months If you have labs (blood work) drawn today and your tests are completely normal, you will receive your results only by: MyChart Message (if you have MyChart) OR A paper copy in the mail If you have any lab test that is abnormal or we need to change your treatment, we will call you to review the results.    Follow-Up: At [facility], you and your health needs are our priority.  As part of our continuing mission to provide you with exceptional heart care, we have created designated Provider Care Teams.  These Care Teams include your primary Cardiologist (physician) and Advanced Practice Providers (APPs -  Physician Assistants and Nurse Practitioners) who all work together to provide you with the care you need, when you need it.   Your next appointment:   2 month(s)  The format for your next appointment:   In Person  Provider:   [facility], MD     Other Instructions  Check BP daily for 1 week and call office with results

## 2022-01-24 ENCOUNTER — Ambulatory Visit (HOSPITAL_COMMUNITY)
Admission: RE | Admit: 2022-01-24 | Discharge: 2022-01-24 | Disposition: A | Discharge status: Home / Self Care | Payer: 59 | Source: Ambulatory Visit | Attending: Cardiology | Admitting: Cardiology

## 2022-01-24 DIAGNOSIS — I10 Essential (primary) hypertension: Secondary | ICD-10-CM | POA: Diagnosis present

## 2022-01-24 DIAGNOSIS — R7989 Other specified abnormal findings of blood chemistry: Secondary | ICD-10-CM | POA: Diagnosis present

## 2022-01-31 ENCOUNTER — Telehealth: Payer: Self-pay | Admitting: Cardiology

## 2022-01-31 NOTE — Telephone Encounter (Signed)
Penny from [facility] states that the referral from Dr. Mayford Knife for endocrinology is too light and they cannot read it, they are requesting it be faxed over again to   Fax : [phone]  Attention: [employer]

## 2022-02-09 ENCOUNTER — Encounter: Payer: Self-pay | Admitting: Cardiology

## 2022-02-14 NOTE — Telephone Encounter (Signed)
Left message for patient to call back  

## 2022-02-15 ENCOUNTER — Telehealth: Payer: Self-pay | Admitting: Cardiology

## 2022-02-15 MED ORDER — IRBESARTAN 300 MG PO TABS: 300.0000 mg | ORAL_TABLET | Freq: Every day | ORAL | 3 refills | Status: DC

## 2022-02-15 NOTE — Telephone Encounter (Signed)
Spoke with patient to review new instructions from Dr Mayford Knife.  Stop Losartan. Continue Toprol and start Avapro 300 mg daily. Continue to monitor BP and let Dr Mayford Knife know what her readings are. Patient verbalized understanding. She sees endocrinology in January.

## 2022-02-15 NOTE — Telephone Encounter (Signed)
Pt returning nurses call per MyChart message. Please advise

## 2022-03-27 ENCOUNTER — Encounter: Payer: Self-pay | Admitting: Cardiology

## 2022-03-27 ENCOUNTER — Ambulatory Visit: Payer: 59 | Attending: Cardiology | Admitting: Cardiology

## 2022-03-27 VITALS — BP 160/102 | HR 79 | Ht 66.0 in | Wt 133.0 lb

## 2022-03-27 DIAGNOSIS — E7800 Pure hypercholesterolemia, unspecified: Secondary | ICD-10-CM

## 2022-03-27 DIAGNOSIS — I10 Essential (primary) hypertension: Secondary | ICD-10-CM | POA: Diagnosis not present

## 2022-03-27 DIAGNOSIS — R7989 Other specified abnormal findings of blood chemistry: Secondary | ICD-10-CM

## 2022-03-27 DIAGNOSIS — R000 Tachycardia, unspecified: Secondary | ICD-10-CM

## 2022-03-27 NOTE — Patient Instructions (Signed)
Medication Instructions:  Your physician recommends that you continue on your current medications as directed. Please refer to the Current Medication list given to you today.  *If you need a refill on your cardiac medications before your next appointment, please call your pharmacy*   Lab Work: Please complete an NMR lipoprotein a and apoprotein b at the lab of your choice. You may need to be fasting for these labs.   If you have labs (blood work) drawn today and your tests are completely normal, you will receive your results only by: [facility] (if you have MyChart) OR A paper copy in the mail If you have any lab test that is abnormal or we need to change your treatment, we will call you to review the results.   Testing/Procedures: None.   Follow-Up: At [facility], you and your health needs are our priority.  As part of our continuing mission to provide you with exceptional heart care, we have created designated Provider Care Teams.  These Care Teams include your primary Cardiologist (physician) and Advanced Practice Providers (APPs -  Physician Assistants and Nurse Practitioners) who all work together to provide you with the care you need, when you need it.  We recommend signing up for the patient portal called "MyChart".  Sign up information is provided on this After Visit Summary.  MyChart is used to connect with patients for Virtual Visits (Telemedicine).  Patients are able to view lab/test results, encounter notes, upcoming appointments, etc.  Non-urgent messages can be sent to your provider as well.   To learn more about what you can do with MyChart, go to [email or web address].    Your next appointment is 09/25/22 at 8:20 AM with  Provider:   Fransico Him, MD

## 2022-03-27 NOTE — Addendum Note (Signed)
Addended by: Joni Reining on: 03/27/2022 02:32 PM   Modules accepted: Orders

## 2022-03-27 NOTE — Progress Notes (Signed)
Cardiology office note    Date:  03/27/2022   ID:  Taline, Nass 11/26/1965, MRN [id number]  PCP:  Delsa Bern, MD  Cardiologist:  Fransico Him, MD   Chief Complaint  Patient presents with   Hypertension   Hyperlipidemia    History of Present Illness:  Renee Keith is a 57 y.o. female with a history of hypertension for several years followed by her PCP.  She has a very strong family history of cancer.  Her mom died of ovarian cancer at 61.  She is unaware of any cardiac history in first-degree relatives but a grandparent on her mom side had coronary disease.  She has never smoked.  She has been maintained on losartan HCT initially but then developed hypercalcemia and the diuretic was stopped and her losartan was increased to 100 mg daily.   Her cholesterol has also been elevated with a total cholesterol 264, triglycerides 169, HDL 76 and LDL 158.  Her LDL goal is less than 130 and preferably less than 100.  She is postmenopausal.    Coronary calcium score was done which was 0.  Her blood pressure has been elevated despite Losartan and Amlodipine was added but she developed LE edema.  Her amlodipine was stopped and she was started on Aldactone but hypercalcemia reoccurred and diuretics was stopped.  Her Losartan was increased.   A ziopatch was ordered due to increased resting HR and this showed PAT up to 6 min in duration as well as PVCs.  She was started on Toprol XL 25mg  daily but had fatigue and some dizziness so her Toprol was moved to nightly instead of in the am.  Repeat Calcium off of diuretics remained elevated > 11.  Workup for hypercalcemia was negative.  SHe has been referred to endocrinology.    She is here today for followup and is doing well.  She denies any chest pain or pressure, SOB, DOE, PND, orthopnea, LE edema, dizziness, palpitations or syncope. She is compliant with her meds and is tolerating meds with no SE.    Past Medical History:  Diagnosis Date   Family  history of adverse reaction to anesthesia    pt son-- ponv   Family history of breast cancer    Family history of melanoma    Family history of ovarian cancer    Family history of prostate cancer    Hypertension    followed by pcp  (per pt never had a stress test)   Hypothyroidism    followed by pcp   PONV (postoperative nausea and vomiting)    Wears contact lenses     Past Surgical History:  Procedure Laterality Date   BREAST IMPLANT REMOVAL  2014   BUNIONECTOMY Right 2018   PLACEMENT OF BREAST IMPLANTS Bilateral 2010   ROBOTIC ASSISTED SALPINGO OOPHERECTOMY Bilateral 11/11/2019   Procedure: XI ROBOTIC ASSISTED SALPINGO OOPHORECTOMY;  Surgeon: Delsa Bern, MD;  Location: [facility];  Service: Gynecology;  Laterality: Bilateral;   WISDOM TOOTH EXTRACTION     WRIST GANGLION EXCISION Left 12-23-2018   @[facility]     Current Medications: Current Meds  Medication Sig   Cholecalciferol (VITAMIN D-3 PO) Take by mouth daily.   irbesartan (AVAPRO) 300 MG tablet Take 1 tablet (300 mg total) by mouth daily.   levothyroxine (SYNTHROID) 112 MCG tablet Take 112 mcg by mouth daily before breakfast.   metoprolol succinate (TOPROL-XL) 50 MG 24 hr tablet Take 1 tablet (50 mg total) by mouth  daily. Take with or immediately following a meal.    Allergies:   Penicillins, Bee venom, Elemental sulfur, and Sulfa antibiotics   Social History   Socioeconomic History   Marital status: Married    Spouse name: Not on file   Number of children: Not on file   Years of education: Not on file   Highest education level: Not on file  Occupational History   Not on file  Tobacco Use   Smoking status: Never   Smokeless tobacco: Never  Vaping Use   Vaping Use: Never used  Substance and Sexual Activity   Alcohol use: Yes    Comment: occasional   Drug use: Never   Sexual activity: Yes    Birth control/protection: None  Other Topics Concern   Not on file  Social History Narrative   Not  on file   Social Determinants of Health   Financial Resource Strain: Not on file  Food Insecurity: Not on file  Transportation Needs: Not on file  Physical Activity: Not on file  Stress: Not on file  Social Connections: Not on file     Family History:  The patient's family history includes Breast cancer (age of onset: 52) in her cousin; Breast cancer (age of onset: 21) in an other family member; Melanoma (age of onset: 63) in her brother; Ovarian cancer (age of onset: 44) in her mother; Prostate cancer (age of onset: 56) in her maternal uncle; Prostate cancer (age of onset: 36) in her maternal uncle; Prostate cancer (age of onset: 39) in her father.   ROS:   Please see the history of present illness.    ROS All other systems reviewed and are negative.      No data to display             PHYSICAL EXAM:   VS:  BP (!) 160/102   Pulse 79   Ht 5\' 6"  (1.676 m)   Wt 133 lb (60.3 kg)   SpO2 98%   BMI 21.47 kg/m    GEN: Well nourished, well developed in no acute distress HEENT: Normal NECK: No JVD; No carotid bruits LYMPHATICS: No lymphadenopathy CARDIAC:RRR, no murmurs, rubs, gallops RESPIRATORY:  Clear to auscultation without rales, wheezing or rhonchi  ABDOMEN: Soft, non-tender, non-distended MUSCULOSKELETAL:  No edema; No deformity  SKIN: Warm and dry NEUROLOGIC:  Alert and oriented x 3 PSYCHIATRIC:  Normal affect  Wt Readings from Last 3 Encounters:  03/27/22 133 lb (60.3 kg)  01/23/22 134 lb (60.8 kg)  10/27/21 134 lb 6.4 oz (61 kg)      Studies/Labs Reviewed:   EKG:  EKG is ordered today and demonstrates NSR with no ST changes  Recent Labs: 01/04/2022: BUN 18; Creatinine, Ser 1.26; Potassium 4.7; Sodium 130; TSH 1.540   Lipid Panel    Component Value Date/Time   CHOL 235 (H) 10/27/2021 0941   TRIG 118 10/27/2021 0941   HDL 81 10/27/2021 0941   CHOLHDL 2.9 10/27/2021 0941   LDLCALC 134 (H) 10/27/2021 0941    Additional studies/ records that were  reviewed today include:  Coronary artery calcium score    ASSESSMENT:    1. Benign essential HTN   2. Pure hypercholesterolemia   3. Tachycardia, unspecified   4. Elevated serum creatinine      PLAN:  In order of problems listed above:  Hypertension -BP is elevated today on exam today but at home is 120/34mmhg -Continue prescription drug management irbesartan 300 mg daily and Toprol-XL  50 mg daily with as needed refills -Her diuretic was stopped due to hypercalcemia -intolerant to amlodipine due to LE edema -she will send me a copy of her BPs on my chart  2.  Hyperlipidemia -Her HDL was good at 76 but LDL was 158 -Coronary calcium score was 0 -labs 09/2021 showed LDL 134, HDL 81 and Lp(a) was normal at 87 -would like to see her LDL < 100 -encouraged her to work on diet and exercise and will recheck an NMR panel in 4 months  3.  Tachycardia -Hr controlled today -she is on Synthroid and TSH was normal at 1.54 on 01/04/2022 -Ziopatch showed 2 runs of PAT as high as 193bpm with longest run lasting 6 min and 19 sec and average heart rate 96bpm -She denies any further palpitations and heart rate controlled on Toprol-XL 50 mg daily -Continue prescription drug with Toprol-XL 50 mg daily with as needed refills  4.  Hypercalcemia -workup normal -Followed by endocrinology   Medication Adjustments/Labs and Tests Ordered: Current medicines are reviewed at length with the patient today.  Concerns regarding medicines are outlined above.  Medication changes, Labs and Tests ordered today are listed in the Patient Instructions below. Signed, Armanda Magic, MD  03/27/2022 2:09 PM    [facility] [address] [place], [place], [place]  [address] Phone: [phone]; Fax: [phone]

## 2022-03-28 ENCOUNTER — Other Ambulatory Visit (HOSPITAL_BASED_OUTPATIENT_CLINIC_OR_DEPARTMENT_OTHER): Payer: Self-pay | Admitting: Cardiology

## 2022-03-29 LAB — LIPOPROTEIN A (LPA): Lipoprotein (a): 22.5 nmol/L (ref <75.0)

## 2022-03-29 LAB — APOLIPOPROTEIN B: Apolipoprotein B: 135 mg/dL — ABNORMAL HIGH (ref <90)

## 2022-04-05 ENCOUNTER — Other Ambulatory Visit (HOSPITAL_COMMUNITY): Payer: Self-pay | Admitting: Endocrinology

## 2022-04-05 DIAGNOSIS — E210 Primary hyperparathyroidism: Secondary | ICD-10-CM

## 2022-04-13 ENCOUNTER — Encounter (HOSPITAL_COMMUNITY): Payer: 59

## 2022-04-17 ENCOUNTER — Encounter (HOSPITAL_COMMUNITY): Payer: 59

## 2022-04-17 ENCOUNTER — Encounter (HOSPITAL_COMMUNITY)
Admission: RE | Admit: 2022-04-17 | Discharge: 2022-04-17 | Disposition: A | Discharge status: Home / Self Care | Payer: 59 | Source: Ambulatory Visit | Attending: Endocrinology | Admitting: Endocrinology

## 2022-04-17 DIAGNOSIS — E210 Primary hyperparathyroidism: Secondary | ICD-10-CM | POA: Diagnosis present

## 2022-04-17 MED ORDER — TECHNETIUM TC 99M SESTAMIBI GENERIC - CARDIOLITE
25.8000 | Freq: Once | INTRAVENOUS | Status: AC | PRN
  Administered 2022-04-17: 25.8 via INTRAVENOUS

## 2022-05-09 ENCOUNTER — Encounter: Payer: Self-pay | Admitting: Cardiology

## 2022-05-11 ENCOUNTER — Telehealth: Payer: Self-pay

## 2022-05-11 NOTE — Telephone Encounter (Signed)
Called patient to review medications and ask her to keep a blood pressure log for another week to ensure her medications are effective at her current dosage.  Confirmed with patient that her current medications are:  Cholecalciferol (Vitamin D3) Irbesartan 300 mg daily Levothyroxine 112 mcg daily Metoprolol 50 mg daily  Patient declines to keep another BP log at this time, she states she feels her pressures are well-controlled. Asked patient to clarify if she was on the losartan which was supposed to be discontinued when she kept the blood pressure log she just recently submitted. Patient states that she was NOT on losartan when she logged those blood pressures. Patient states she is "tired of dealing with this" and would prefer to wait for her next appointment with Dr. Radford Pax in July to address her pressures again. Patient states she is "very angry" with the communication she received from our office and she only wants to talk to Dr. Radford Pax. Attempted to reassure patient that it's very easy to get medications mixed up which is why we are phoning to check on everything, patient insists she does not want help or assistance at this time. Advised patient to call us if she has any further concerns or new symptoms.

## 2022-05-23 ENCOUNTER — Ambulatory Visit: Payer: 59

## 2022-09-25 ENCOUNTER — Ambulatory Visit: Payer: 59 | Attending: Cardiology | Admitting: Cardiology

## 2022-09-25 ENCOUNTER — Encounter: Payer: Self-pay | Admitting: Cardiology

## 2022-09-25 ENCOUNTER — Telehealth: Payer: Self-pay

## 2022-09-25 VITALS — BP 130/94 | HR 73 | Ht 66.0 in | Wt 136.6 lb

## 2022-09-25 DIAGNOSIS — E7800 Pure hypercholesterolemia, unspecified: Secondary | ICD-10-CM | POA: Diagnosis not present

## 2022-09-25 DIAGNOSIS — R7989 Other specified abnormal findings of blood chemistry: Secondary | ICD-10-CM | POA: Diagnosis not present

## 2022-09-25 DIAGNOSIS — I10 Essential (primary) hypertension: Secondary | ICD-10-CM | POA: Diagnosis not present

## 2022-09-25 DIAGNOSIS — R000 Tachycardia, unspecified: Secondary | ICD-10-CM

## 2022-09-25 NOTE — Progress Notes (Addendum)
Cardiology office note    Date:  09/25/2022   ID:  Martell, Racca November 02, 1965, MRN [id number]  PCP:  Silverio Lay, MD  Cardiologist:  Armanda Magic, MD   Chief Complaint  Patient presents with   Follow-up    HTN, PAT    History of Present Illness:  LINAE Keith is a 57 y.o. female with a history of hypertension for several years followed by her PCP.  She has a very strong family history of cancer.  Her mom died of ovarian cancer at 41.  She is unaware of any cardiac history in first-degree relatives but a grandparent on her mom side had coronary disease.  She has never smoked.  She has been maintained on losartan HCT initially but then developed hypercalcemia and the diuretic was stopped and her losartan was increased to 100 mg daily.   Her cholesterol has also been elevated with a total cholesterol 264, triglycerides 169, HDL 76 and LDL 158.  Her LDL goal is less than 130 and preferably less than 100.  She is postmenopausal.    Coronary calcium score was done which was 0.  Her blood pressure has been elevated despite Losartan and Amlodipine was added but she developed LE edema.  Her amlodipine was stopped and she was started on Aldactone but hypercalcemia reoccurred and diuretics was stopped.  Her Losartan was increased.   A ziopatch was ordered due to increased resting HR and this showed PAT up to 6 min in duration as well as PVCs.  She was started on Toprol XL 25mg  daily but had fatigue and some dizziness so her Toprol was moved to nightly instead of in the am.  Repeat Calcium off of diuretics remained elevated > 11.  Workup for hypercalcemia was negative.  She is being followed by endocrinology  She is here today for followup and is doing well.  She denies any chest pain or pressure, SOB, DOE, PND, orthopnea, LE edema, dizziness, palpitations or syncope. She is compliant with her meds and is tolerating meds with no SE.    Past Surgical History:  Procedure Laterality Date   BREAST  IMPLANT REMOVAL  2014   BUNIONECTOMY Right 2018   PLACEMENT OF BREAST IMPLANTS Bilateral 2010   ROBOTIC ASSISTED SALPINGO OOPHERECTOMY Bilateral 11/11/2019   Procedure: XI ROBOTIC ASSISTED SALPINGO OOPHORECTOMY;  Surgeon: Silverio Lay, MD;  Location: [facility];  Service: Gynecology;  Laterality: Bilateral;   WISDOM TOOTH EXTRACTION     WRIST GANGLION EXCISION Left 12-23-2018   @[facility]     Current Medications: Current Meds  Medication Sig   Cholecalciferol (VITAMIN D-3 PO) Take by mouth daily.   estradiol (VIVELLE-DOT) 0.05 MG/24HR patch Place 1 patch onto the skin 2 (two) times a week.   irbesartan (AVAPRO) 300 MG tablet Take 1 tablet (300 mg total) by mouth daily.   levothyroxine (SYNTHROID) 112 MCG tablet Take 112 mcg by mouth daily before breakfast.   metoprolol succinate (TOPROL-XL) 50 MG 24 hr tablet Take 1 tablet (50 mg total) by mouth daily. Take with or immediately following a meal.   progesterone (PROMETRIUM) 100 MG capsule Take 1 tablet by mouth daily.    Allergies:   Penicillins, Bee venom, Elemental sulfur, and Sulfa antibiotics   Social History   Socioeconomic History   Marital status: Married    Spouse name: Not on file   Number of children: Not on file   Years of education: Not on file   Highest education level:  Not on file  Occupational History   Not on file  Tobacco Use   Smoking status: Never   Smokeless tobacco: Never  Vaping Use   Vaping status: Never Used  Substance and Sexual Activity   Alcohol use: Yes    Comment: occasional   Drug use: Never   Sexual activity: Yes    Birth control/protection: None  Other Topics Concern   Not on file  Social History Narrative   Not on file   Social Determinants of Health   Financial Resource Strain: Not on file  Food Insecurity: Not on file  Transportation Needs: Not on file  Physical Activity: Not on file  Stress: Not on file  Social Connections: Not on file     Family History:  The  patient's family history includes Breast cancer (age of onset: 24) in her cousin; Breast cancer (age of onset: 78) in an other family member; Melanoma (age of onset: 32) in her brother; Ovarian cancer (age of onset: 86) in her mother; Prostate cancer (age of onset: 64) in her maternal uncle; Prostate cancer (age of onset: 22) in her maternal uncle; Prostate cancer (age of onset: 56) in her father.   ROS:   Please see the history of present illness.    ROS All other systems reviewed and are negative.      No data to display             PHYSICAL EXAM:   VS:  BP (!) 130/94 (BP Location: Left Arm, Patient Position: Sitting, Cuff Size: Normal)   Pulse 73   Ht 5\' 6"  (1.676 m)   Wt 136 lb 9.6 oz (62 kg)   SpO2 97%   BMI 22.05 kg/m    GEN: Well nourished, well developed in no acute distress HEENT: Normal NECK: No JVD; No carotid bruits LYMPHATICS: No lymphadenopathy CARDIAC:RRR, no murmurs, rubs, gallops RESPIRATORY:  Clear to auscultation without rales, wheezing or rhonchi  ABDOMEN: Soft, non-tender, non-distended MUSCULOSKELETAL:  No edema; No deformity  SKIN: Warm and dry NEUROLOGIC:  Alert and oriented x 3 PSYCHIATRIC:  Normal affect    Wt Readings from Last 3 Encounters:  09/25/22 136 lb 9.6 oz (62 kg)  03/27/22 133 lb (60.3 kg)  01/23/22 134 lb (60.8 kg)      Studies/Labs Reviewed:   EKG Interpretation Date/Time:  Monday September 25 2022 08:44:47 EDT Ventricular Rate:  73 PR Interval:  144 QRS Duration:  74 QT Interval:  392 QTC Calculation: 431 R Axis:   72  Text Interpretation: Normal sinus rhythm Normal ECG When compared with ECG of 11-Nov-2019 06:04, No significant change was found Confirmed by Armanda Magic ([id number]) on 09/25/2022 9:14:36 AM    Recent Labs: 01/04/2022: BUN 18; Creatinine, Ser 1.26; Potassium 4.7; Sodium 130; TSH 1.540   Lipid Panel    Component Value Date/Time   CHOL 235 (H) 10/27/2021 0941   TRIG 118 10/27/2021 0941   HDL 81 10/27/2021  0941   CHOLHDL 2.9 10/27/2021 0941   LDLCALC 134 (H) 10/27/2021 0941    Additional studies/ records that were reviewed today include:  Coronary artery calcium score    ASSESSMENT:    1. Benign essential HTN   2. Pure hypercholesterolemia   3. Tachycardia, unspecified   4. Elevated serum creatinine       PLAN:  In order of problems listed above:  Hypertension -BP is borderline controlled on exam today but did not take her BP meds today -Continue drug management with  irbesartan 300 mg daily and Toprol-XL 50 mg daily with as needed refills -Her diuretic was stopped due to hypercalcemia -intolerant to amlodipine due to LE edema -Check BMET today -I have asked her to check her BP daily for a week and send a message on My Chart  2.  Hyperlipidemia -Her HDL was good at 76 but LDL was 158 -Coronary calcium score was 0 -labs 09/2021 showed LDL 134, HDL 81 and Lp(a) was normal at 87 -would like to see her LDL < 100 -encouraged her to work on diet and exercise  -Repeat NMR lipid panel  3.  Tachycardia/PAT -Heart rate is in the normal range -she is on Synthroid and TSH was normal at 1.54 on 01/04/2022 -Ziopatch showed 2 runs of PAT as high as 193bpm with longest run lasting 6 min and 19 sec and average heart rate 96bpm -She is maintaining normal sinus rhythm on exam and denies any palpitations -Continue drug management with Toprol-XL 50 mg daily with as needed refills  4.  Hypercalcemia -workup normal -check BMET, Ca and PTH -Refer for second opinion to Dr. Sharl Ma  Followup with me in 6 months   Medication Adjustments/Labs and Tests Ordered: Current medicines are reviewed at length with the patient today.  Concerns regarding medicines are outlined above.  Medication changes, Labs and Tests ordered today are listed in the Patient Instructions below.   Signed, Armanda Magic, MD  09/25/2022 9:14 AM    [facility] [address] [place], [place], [place]   [address] Phone: [phone]; Fax: [phone]

## 2022-09-25 NOTE — Telephone Encounter (Signed)
Call to patient to schedule follow up labs and referrals from today's visit. Patient needs to schedule BMET, Ca, PTH, NMR panel and 6 month f/u. Also needs referral to Dr. Sharl Ma at [facility].

## 2022-09-29 ENCOUNTER — Telehealth: Payer: Self-pay

## 2022-09-29 DIAGNOSIS — Z79899 Other long term (current) drug therapy: Secondary | ICD-10-CM

## 2022-09-29 NOTE — Telephone Encounter (Signed)
Called to schedule labs, NMR already ordered and scheduled, added orders for BMET, Ca, PTH.

## 2022-09-29 NOTE — Telephone Encounter (Signed)
LFT added to labs 10/03/22.

## 2022-09-29 NOTE — Addendum Note (Signed)
Addended by: Luellen Pucker on: 09/29/2022 01:40 PM   Modules accepted: Orders

## 2022-09-29 NOTE — Addendum Note (Signed)
Addended by: Luellen Pucker on: 09/29/2022 01:31 PM   Modules accepted: Orders

## 2022-09-29 NOTE — Telephone Encounter (Signed)
Also discussed referral to Dr. Sharl Ma at [facility] (referral placed). Requested records from Dr. Talmage Nap at [facility].

## 2022-10-03 ENCOUNTER — Ambulatory Visit: Payer: 59

## 2022-10-10 ENCOUNTER — Ambulatory Visit: Payer: 59 | Attending: Cardiology

## 2022-10-17 ENCOUNTER — Telehealth: Payer: Self-pay | Admitting: Cardiology

## 2022-10-17 NOTE — Addendum Note (Signed)
Addended by: Rexene Edison L on: 10/17/2022 04:00 PM   Modules accepted: Orders

## 2022-10-17 NOTE — Telephone Encounter (Signed)
Patient called and mentioned that she thinks that Dr. Mayford Knife sent in the referral to the wrong office. Says that it was supposed to go to the ENDOCRINOLOGY. Patient gave me a fax number she received as [phone] ATTN: Holly. Was told to send most recent office notes and also three most recent labs.

## 2022-10-17 NOTE — Telephone Encounter (Signed)
Called patient back to discuss her concerns about endocrinology referral. She states she called Dr. Daune Perch office at [facility] and was told he is no longer practicing there. After a google search, Dr. Sharl Ma is now practicing at [facility] but may not be taking new patients. Left message with Herbert Seta asking if new patients were being accepted.

## 2022-10-19 ENCOUNTER — Ambulatory Visit: Payer: 59 | Attending: Cardiology

## 2022-10-19 DIAGNOSIS — Z79899 Other long term (current) drug therapy: Secondary | ICD-10-CM

## 2022-10-19 DIAGNOSIS — E7800 Pure hypercholesterolemia, unspecified: Secondary | ICD-10-CM

## 2022-10-20 LAB — SPECIMEN STATUS

## 2022-10-21 LAB — SPECIMEN STATUS

## 2022-10-21 LAB — SPECIMEN STATUS REPORT

## 2022-10-23 ENCOUNTER — Telehealth: Payer: Self-pay

## 2022-10-23 LAB — BASIC METABOLIC PANEL
BUN/Creatinine Ratio: 16 (ref 9–23)
BUN: 14 mg/dL (ref 6–24)
CO2: 22 mmol/L (ref 20–29)
Calcium: 10.3 mg/dL — ABNORMAL HIGH (ref 8.7–10.2)
Chloride: 100 mmol/L (ref 96–106)
Creatinine, Ser: 0.86 mg/dL (ref 0.57–1.00)
Glucose: 73 mg/dL (ref 70–99)
Potassium: 4.4 mmol/L (ref 3.5–5.2)
Sodium: 138 mmol/L (ref 134–144)
eGFR: 78 mL/min/{1.73_m2} (ref >59)

## 2022-10-23 LAB — PARATHYROID HORMONE, INTACT (NO CA)

## 2022-10-23 LAB — HEPATIC FUNCTION PANEL
ALT: 27 IU/L (ref 0–32)
AST: 43 IU/L — ABNORMAL HIGH (ref 0–40)
Albumin: 4.6 g/dL (ref 3.8–4.9)
Alkaline Phosphatase: 91 IU/L (ref 44–121)
Bilirubin Total: 0.5 mg/dL (ref 0.0–1.2)
Bilirubin, Direct: 0.19 mg/dL (ref 0.00–0.40)
Total Protein: 7.7 g/dL (ref 6.0–8.5)

## 2022-10-23 NOTE — Telephone Encounter (Signed)
Called and left detailed message per DPR asking patient to call our office. Reviewed Ca is improved, Dr. Mayford Knife advises minimal elevation present in AST, states this can be iimproved with cutting back on any alcohol or tylenol use.   Also advised that PTH result is not available as the lab reports there was some kind of problem on their end-they report sample was not frozen and request patient come in to provide another sample. Requested patient call our office to schedule.

## 2022-10-23 NOTE — Telephone Encounter (Signed)
-----   Message from Armanda Magic sent at 10/23/2022  9:49 AM EDT ----- Ca much improved!  Minimal elevation in AST >>cut back on any ETOH and Tylenol use.  PTH pending

## 2022-10-31 ENCOUNTER — Ambulatory Visit: Payer: 59

## 2022-11-03 ENCOUNTER — Other Ambulatory Visit: Payer: 59

## 2022-11-03 ENCOUNTER — Other Ambulatory Visit: Payer: Self-pay

## 2022-11-03 ENCOUNTER — Ambulatory Visit: Payer: 59 | Attending: Cardiology

## 2022-11-03 DIAGNOSIS — I10 Essential (primary) hypertension: Secondary | ICD-10-CM

## 2022-11-03 DIAGNOSIS — Z79899 Other long term (current) drug therapy: Secondary | ICD-10-CM

## 2022-11-03 DIAGNOSIS — R000 Tachycardia, unspecified: Secondary | ICD-10-CM

## 2022-11-03 DIAGNOSIS — E7800 Pure hypercholesterolemia, unspecified: Secondary | ICD-10-CM

## 2022-11-03 DIAGNOSIS — R7989 Other specified abnormal findings of blood chemistry: Secondary | ICD-10-CM

## 2022-11-04 LAB — NMR, LIPOPROFILE
Cholesterol, Total: 293 mg/dL — ABNORMAL HIGH (ref 100–199)
HDL Particle Number: 46.5 umol/L (ref >=30.5)
HDL-C: 69 mg/dL (ref >39)
LDL Particle Number: 1787 nmol/L — ABNORMAL HIGH (ref <1000)
LDL Size: 21.3 nm (ref >20.5)
LDL-C (NIH Calc): 186 mg/dL — ABNORMAL HIGH (ref 0–99)
LP-IR Score: 25 (ref <=45)
Small LDL Particle Number: 715 nmol/L — ABNORMAL HIGH (ref <=527)
Triglycerides: 204 mg/dL — ABNORMAL HIGH (ref 0–149)

## 2022-11-05 LAB — SPECIMEN STATUS

## 2022-11-06 ENCOUNTER — Telehealth: Payer: Self-pay

## 2022-11-06 NOTE — Telephone Encounter (Signed)
-----   Message from Renee Keith sent at 11/06/2022  2:14 PM EDT ----- I talked with patient and her lipids are very high. 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  Her LDL particle number is high and mainly the small dense atherogenic particles.Recommended that she start Crestor 20mg  daily with an LDL goal < 100 and APOB <90.  Please get an NMR fasting lipid and ALT in 6 weeks

## 2022-11-06 NOTE — Progress Notes (Signed)
I talked with patient and her lipids are very high. 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  Her LDL particle number is high and mainly the small dense atherogenic particles.Recommended that she start Crestor 20mg  daily with an LDL goal < 100 and APOB <90.  Please get an NMR fasting lipid and ALT in 6 weeks

## 2022-11-06 NOTE — Telephone Encounter (Signed)
Call to patient discuss cholesterol labs as well as recent PTH, hepatic function labs. No answer, left detailed message per DPR asking to call our office.

## 2022-11-07 ENCOUNTER — Telehealth: Payer: Self-pay

## 2022-11-07 DIAGNOSIS — Z79899 Other long term (current) drug therapy: Secondary | ICD-10-CM

## 2022-11-07 DIAGNOSIS — I10 Essential (primary) hypertension: Secondary | ICD-10-CM

## 2022-11-07 DIAGNOSIS — R000 Tachycardia, unspecified: Secondary | ICD-10-CM

## 2022-11-07 LAB — APOLIPOPROTEIN B: Apolipoprotein B: 138 mg/dL — ABNORMAL HIGH (ref <90)

## 2022-11-07 LAB — PARATHYROID HORMONE, INTACT (NO CA): PTH: 24 pg/mL (ref 15–65)

## 2022-11-07 LAB — LIPOPROTEIN A (LPA): Lipoprotein (a): 22.8 nmol/L (ref <75.0)

## 2022-11-07 NOTE — Telephone Encounter (Signed)
Heather with [employer] called in to report pt has an extra lavender tube of blood.  Wants to know if there is an order that needs to be drawn.  Advised Herbert Seta looks like everything RN ordered was drawn: Apolipoprotein B, Lipoprotein A and PTH.   There is an incomplete result note for a CBC.  Rep reports this is done automatically.  Will send a message to MD to see if CBC is needed.

## 2022-11-07 NOTE — Telephone Encounter (Signed)
Left a message to call back.

## 2022-11-07 NOTE — Telephone Encounter (Signed)
Spoke with Herbert Seta advised of MD request to draw BMET and TSH.  Herbert Seta reports they are able to draw labs.  I have placed orders and provided Heather with order numbers.  Herbert Seta will fax over an authorization to add testing to be signed by MD.  Reports it will be later today.  Will send message to Primary covering to f/u MD signature.

## 2022-11-07 NOTE — Telephone Encounter (Signed)
Heather from [facility] is returning call. Transferred to [facility], Charity fundraiser.

## 2022-11-13 ENCOUNTER — Telehealth: Payer: Self-pay

## 2022-11-13 NOTE — Telephone Encounter (Signed)
Called to speak with patient about several results.  Per Dr. Mayford Knife, Ca much improved with normal PTH.  Minimal elevation in AST, advised patient cut back on any ETOH and Tylenol use. Also advised patient that her TSH is actually normal.   Patient also advised that her kidney function is a little bit abnormal but stable and just encouraged her to increase her fluids. Patient verbalizes understanding.  Patient confirms Dr. Mayford Knife had already called and talked with her about lipids being very high, including Apolipoprotein B and TAGs.  Discussed her LDL particle number is high. Dr. Mayford Knife recommended that she start Crestor 20mg  daily with an LDL goal < 100 and APOB <90 and get an NMR fasting lipid and ALT in 6 weeks. Patient states her PCP Dr. Estanislado Pandy is checking her TSH again in a few weeks and if that is normal she will consider starting the crestor and baby aspirin.

## 2022-11-13 NOTE — Telephone Encounter (Signed)
-----  Message from Armanda Magic sent at 11/06/2022  2:14 PM EDT ----- I talked with patient and her lipids are very high. 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  Her LDL particle number is high and mainly the small dense atherogenic particles.Recommended that she start Crestor 20mg  daily with an LDL goal < 100 and APOB <90.  Please get an NMR fasting lipid and ALT in 6 weeks

## 2022-11-29 LAB — TSH: TSH: 3.4 u[IU]/mL (ref 0.450–4.500)

## 2022-11-29 LAB — BASIC METABOLIC PANEL
BUN/Creatinine Ratio: 16 (ref 9–23)
BUN: 18 mg/dL (ref 6–24)
CO2: 14 mmol/L — ABNORMAL LOW (ref 20–29)
Calcium: 10.5 mg/dL — ABNORMAL HIGH (ref 8.7–10.2)
Chloride: 101 mmol/L (ref 96–106)
Creatinine, Ser: 1.13 mg/dL — ABNORMAL HIGH (ref 0.57–1.00)
Glucose: 81 mg/dL (ref 70–99)
Potassium: 4.8 mmol/L (ref 3.5–5.2)
Sodium: 142 mmol/L (ref 134–144)
eGFR: 56 mL/min/{1.73_m2} — ABNORMAL LOW (ref >59)

## 2022-11-29 LAB — SPECIMEN STATUS REPORT

## 2022-12-11 ENCOUNTER — Encounter: Payer: Self-pay | Admitting: Cardiology

## 2022-12-11 NOTE — Telephone Encounter (Signed)
Called to see if patient needs order for follow up TSH labs, no answer. Left detailed message per DPR asking patient to complete labs or call our office if she needs lab order.

## 2022-12-28 NOTE — Telephone Encounter (Signed)
See media tab entry for 11/1822 from [facility] MD.

## 2023-02-20 ENCOUNTER — Telehealth: Payer: Self-pay | Admitting: Cardiology

## 2023-02-20 NOTE — Telephone Encounter (Signed)
Office trying to see if we received a fax they sent over on 12/11. Please advise

## 2023-03-24 ENCOUNTER — Other Ambulatory Visit: Payer: Self-pay | Admitting: Cardiology

## 2023-05-08 ENCOUNTER — Other Ambulatory Visit: Payer: Self-pay | Admitting: Obstetrics and Gynecology

## 2023-05-08 DIAGNOSIS — Z1231 Encounter for screening mammogram for malignant neoplasm of breast: Secondary | ICD-10-CM

## 2023-06-27 ENCOUNTER — Other Ambulatory Visit: Payer: Self-pay | Admitting: Cardiology

## 2023-07-25 ENCOUNTER — Ambulatory Visit

## 2023-07-27 ENCOUNTER — Ambulatory Visit
Admission: RE | Admit: 2023-07-27 | Discharge: 2023-07-27 | Disposition: A | Discharge status: Home / Self Care | Source: Ambulatory Visit | Attending: Obstetrics and Gynecology | Admitting: Obstetrics and Gynecology

## 2023-07-27 DIAGNOSIS — Z1231 Encounter for screening mammogram for malignant neoplasm of breast: Secondary | ICD-10-CM

## 2023-08-03 ENCOUNTER — Encounter: Payer: Self-pay | Admitting: Podiatry

## 2023-08-03 ENCOUNTER — Ambulatory Visit (INDEPENDENT_AMBULATORY_CARE_PROVIDER_SITE_OTHER)

## 2023-08-03 ENCOUNTER — Ambulatory Visit: Admitting: Podiatry

## 2023-08-03 DIAGNOSIS — M7751 Other enthesopathy of right foot: Secondary | ICD-10-CM

## 2023-08-03 DIAGNOSIS — M19071 Primary osteoarthritis, right ankle and foot: Secondary | ICD-10-CM

## 2023-08-03 DIAGNOSIS — M722 Plantar fascial fibromatosis: Secondary | ICD-10-CM | POA: Diagnosis not present

## 2023-08-03 NOTE — Patient Instructions (Addendum)
I have ordered a medication for you that will come from [facility] in [place]. They should be calling you to verify insurance and will mail the medication to you. If you live close by then you can go by their pharmacy to pick up the medication. Their phone number is [phone]. If you do not hear from them in the next few days, please give us a call at [phone].   Plantar Fasciitis (Heel Spur Syndrome) with Rehab The plantar fascia is a fibrous, ligament-like, soft-tissue structure that spans the bottom of the foot. Plantar fasciitis is a condition that causes pain in the foot due to inflammation of the tissue. SYMPTOMS  Pain and tenderness on the underneath side of the foot. Pain that worsens with standing or walking. CAUSES  Plantar fasciitis is caused by irritation and injury to the plantar fascia on the underneath side of the foot. Common mechanisms of injury include: Direct trauma to bottom of the foot. Damage to a small nerve that runs under the foot where the main fascia attaches to the heel bone. Stress placed on the plantar fascia due to bone spurs. RISK INCREASES WITH:  Activities that place stress on the plantar fascia (running, jumping, pivoting, or cutting). Poor strength and flexibility. Improperly fitted shoes. Tight calf muscles. Flat feet. Failure to warm-up properly before activity. Obesity. PREVENTION Warm up and stretch properly before activity. Allow for adequate recovery between workouts. Maintain physical fitness: Strength, flexibility, and endurance. Cardiovascular fitness. Maintain a health body weight. Avoid stress on the plantar fascia. Wear properly fitted shoes, including arch supports for individuals who have flat feet.  PROGNOSIS  If treated properly, then the symptoms of plantar fasciitis usually resolve without surgery. However, occasionally surgery is necessary.  RELATED COMPLICATIONS  Recurrent symptoms that may result in a  chronic condition. Problems of the lower back that are caused by compensating for the injury, such as limping. Pain or weakness of the foot during push-off following surgery. Chronic inflammation, scarring, and partial or complete fascia tear, occurring more often from repeated injections.  TREATMENT  Treatment initially involves the use of ice and medication to help reduce pain and inflammation. The use of strengthening and stretching exercises may help reduce pain with activity, especially stretches of the Achilles tendon. These exercises may be performed at home or with a therapist. Your caregiver may recommend that you use heel cups of arch supports to help reduce stress on the plantar fascia. Occasionally, corticosteroid injections are given to reduce inflammation. If symptoms persist for greater than 6 months despite non-surgical (conservative), then surgery may be recommended.   MEDICATION  If pain medication is necessary, then nonsteroidal anti-inflammatory medications, such as aspirin and ibuprofen, or other minor pain relievers, such as acetaminophen, are often recommended. Do not take pain medication within 7 days before surgery. Prescription pain relievers may be given if deemed necessary by your caregiver. Use only as directed and only as much as you need. Corticosteroid injections may be given by your caregiver. These injections should be reserved for the most serious cases, because they may only be given a certain number of times.  HEAT AND COLD Cold treatment (icing) relieves pain and reduces inflammation. Cold treatment should be applied for 10 to 15 minutes every 2 to 3 hours for inflammation and pain and immediately after any activity that aggravates your symptoms. Use ice packs or massage the area with a piece of ice (ice massage). Heat treatment may be used prior to performing the stretching   and strengthening activities prescribed by your caregiver, physical therapist, or  athletic trainer. Use a heat pack or soak the injury in warm water.  SEEK IMMEDIATE MEDICAL CARE IF: Treatment seems to offer no benefit, or the condition worsens. Any medications produce adverse side effects.  EXERCISES- RANGE OF MOTION (ROM) AND STRETCHING EXERCISES - Plantar Fasciitis (Heel Spur Syndrome) These exercises may help you when beginning to rehabilitate your injury. Your symptoms may resolve with or without further involvement from your physician, physical therapist or athletic trainer. While completing these exercises, remember:  Restoring tissue flexibility helps normal motion to return to the joints. This allows healthier, less painful movement and activity. An effective stretch should be held for at least 30 seconds. A stretch should never be painful. You should only feel a gentle lengthening or release in the stretched tissue.  RANGE OF MOTION - Toe Extension, Flexion Sit with your right / left leg crossed over your opposite knee. Grasp your toes and gently pull them back toward the top of your foot. You should feel a stretch on the bottom of your toes and/or foot. Hold this stretch for 10 seconds. Now, gently pull your toes toward the bottom of your foot. You should feel a stretch on the top of your toes and or foot. Hold this stretch for 10 seconds. Repeat  times. Complete this stretch 3 times per day.   RANGE OF MOTION - Ankle Dorsiflexion, Active Assisted Remove shoes and sit on a chair that is preferably not on a carpeted surface. Place right / left foot under knee. Extend your opposite leg for support. Keeping your heel down, slide your right / left foot back toward the chair until you feel a stretch at your ankle or calf. If you do not feel a stretch, slide your bottom forward to the edge of the chair, while still keeping your heel down. Hold this stretch for 10 seconds. Repeat 3 times. Complete this stretch 2 times per day.   STRETCH  Gastroc, Standing Place  hands on wall. Extend right / left leg, keeping the front knee somewhat bent. Slightly point your toes inward on your back foot. Keeping your right / left heel on the floor and your knee straight, shift your weight toward the wall, not allowing your back to arch. You should feel a gentle stretch in the right / left calf. Hold this position for 10 seconds. Repeat 3 times. Complete this stretch 2 times per day.  STRETCH  Soleus, Standing Place hands on wall. Extend right / left leg, keeping the other knee somewhat bent. Slightly point your toes inward on your back foot. Keep your right / left heel on the floor, bend your back knee, and slightly shift your weight over the back leg so that you feel a gentle stretch deep in your back calf. Hold this position for 10 seconds. Repeat 3 times. Complete this stretch 2 times per day.  STRETCH  Gastrocsoleus, Standing  Note: This exercise can place a lot of stress on your foot and ankle. Please complete this exercise only if specifically instructed by your caregiver.  Place the ball of your right / left foot on a step, keeping your other foot firmly on the same step. Hold on to the wall or a rail for balance. Slowly lift your other foot, allowing your body weight to press your heel down over the edge of the step. You should feel a stretch in your right / left calf. Hold this position for   10 seconds. Repeat this exercise with a slight bend in your right / left knee. Repeat 3 times. Complete this stretch 2 times per day.   STRENGTHENING EXERCISES - Plantar Fasciitis (Heel Spur Syndrome)  These exercises may help you when beginning to rehabilitate your injury. They may resolve your symptoms with or without further involvement from your physician, physical therapist or athletic trainer. While completing these exercises, remember:  Muscles can gain both the endurance and the strength needed for everyday activities through controlled exercises. Complete  these exercises as instructed by your physician, physical therapist or athletic trainer. Progress the resistance and repetitions only as guided.  STRENGTH - Towel Curls Sit in a chair positioned on a non-carpeted surface. Place your foot on a towel, keeping your heel on the floor. Pull the towel toward your heel by only curling your toes. Keep your heel on the floor. Repeat 3 times. Complete this exercise 2 times per day.  STRENGTH - Ankle Inversion Secure one end of a rubber exercise band/tubing to a fixed object (table, pole). Loop the other end around your foot just before your toes. Place your fists between your knees. This will focus your strengthening at your ankle. Slowly, pull your big toe up and in, making sure the band/tubing is positioned to resist the entire motion. Hold this position for 10 seconds. Have your muscles resist the band/tubing as it slowly pulls your foot back to the starting position. Repeat 3 times. Complete this exercises 2 times per day.  Document Released: 02/13/2005 Document Revised: 05/08/2011 Document Reviewed: 05/28/2008 [employer] Patient Information 2014 [employer].  

## 2023-08-06 NOTE — Progress Notes (Signed)
  Subjective:  Patient ID: Renee Keith, female    DOB: 1965-09-30,  MRN: [id number]  Chief Complaint  Patient presents with   Foot Pain    RM#11 Patient states has two bumps on the arch of foot and seem to be growing ongoing for 2 years continue to grow with no significant pain.    Discussed the use of AI scribe software for clinical note transcription with the patient, who gave verbal consent to proceed.  History of Present Illness Renee Keith is a 58 year old female who presents with two lumps on the bottom of her right foot.  Two lumps on the bottom of her right foot have been present for some time and are increasing in size without associated pain. She has not attempted any home treatment, and there is no history of injury related to the onset of these lumps.  She underwent surgery on her right big toe for arthritis, which involved cartilage removal. Despite the surgery, she experiences discomfort and her right foot is significantly wider than her left, causing footwear issues unless barefoot. The surgery did not address what she initially thought was a bunion, a condition present in her mother and grandmother. There is no numbness or tingling in her feet.      Objective:    Physical Exam General: AAO x3, NAD  Dermatological: Skin is warm, dry and supple bilateral. There are no open sores, no preulcerative lesions, no rash or signs of infection present.  Vascular: Dorsalis Pedis artery and Posterior Tibial artery pedal pulses are 2/4 bilateral with immedate capillary fill time. There is no pain with calf compression, swelling, warmth, erythema.   Neruologic: Grossly intact via light touch bilateral.   Musculoskeletal: Decreased range of motion of first MTPJ.  There is 2 firm soft tissue masses noted along the medial band of plantar fascia.  No erythema or warmth.  There is no significant pain today.    Results RADIOLOGY Foot X-ray: Arthritis in the first metatarsophalangeal  joint, narrow and uneven joint space, evidence of previous surgery.  No evidence of acute fracture.   Assessment:   1. 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  Assessment and Plan Assessment & Plan Plantar fibromas - Apply verapamil cream daily to soften and shrink fibromas.  Order compound cream through [employer].  - Provide stretching exercises worksheet. - Advise supportive footwear to offload pressure. - Consider steroid injections if painful or unresponsive.  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 Joint narrowing and uneven surfaces causing discomfort. Discussed joint fusion and replacement, recommending non-surgical options. - Discuss custom inserts to offload pressure. - Continue using current arch supports.  Discussed custom inserts if needed.   Return if symptoms worsen or fail to improve.   Charity Conch DPM

## 2023-08-16 ENCOUNTER — Telehealth: Payer: Self-pay | Admitting: Podiatry

## 2023-08-16 NOTE — Telephone Encounter (Signed)
 Patient left message on machine in regards to a cream that was discussed at last appointment. I did not see anything in patients medications that matched or were prescribed by Dr. Clydia Dart. Please advise! Thanks

## 2023-12-27 IMAGING — CT CT CARDIAC CORONARY ARTERY CALCIUM SCORE
3 series · 14 of 20 positions shown, 16 images · non-contrast
Comparison: None.
COMPARISON: None.

Addendum:
EXAM:
OVER-READ INTERPRETATION  CT CHEST

The following report is an over-read performed by radiologist Dr.
Matte Elisabetta [REDACTED] on 06/14/2021. This over-read
does not include interpretation of cardiac or coronary anatomy or
pathology. The coronary calcium score interpretation by the
cardiologist is attached.
TECHNIQUE: A gated, non-contrast computed tomography scan of the heart was
performed using 3mm slice thickness. Axial images were analyzed on a
dedicated workstation. Calcium scoring of the coronary arteries was
performed using the Agatston method.

[Series 2: cascseq 2.0 sa36 70% (id) · axial · 0.39mm/px · z∈[-244,-154]mm · 4 of 76 slices shown]
[im 16/76  vessel]
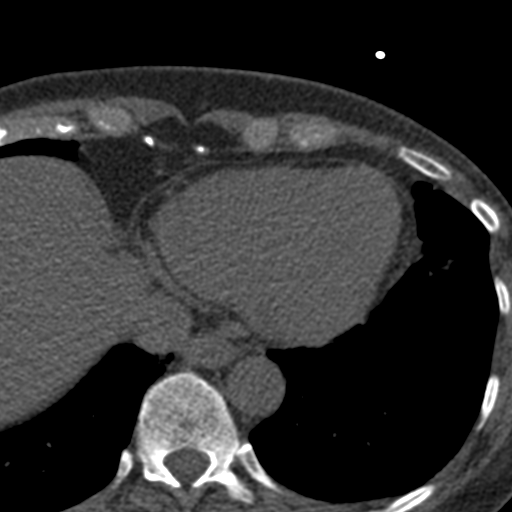
[im 31/76  vessel]
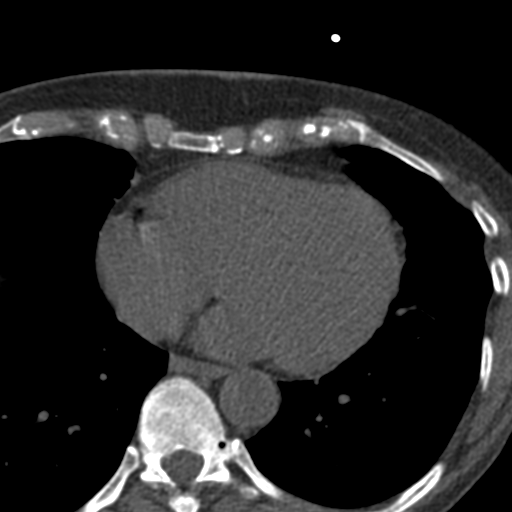
[im 46/76  vessel]
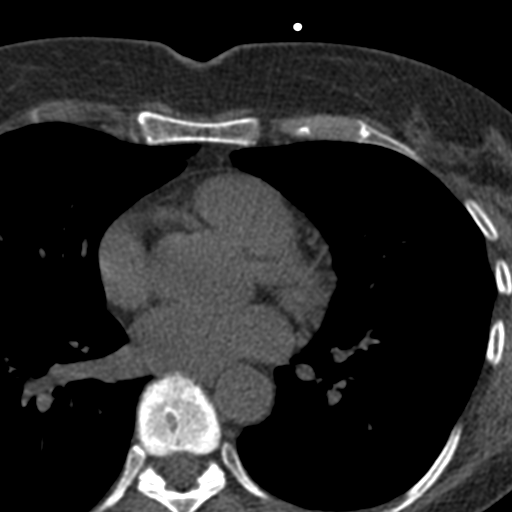
[im 61/76  vessel]
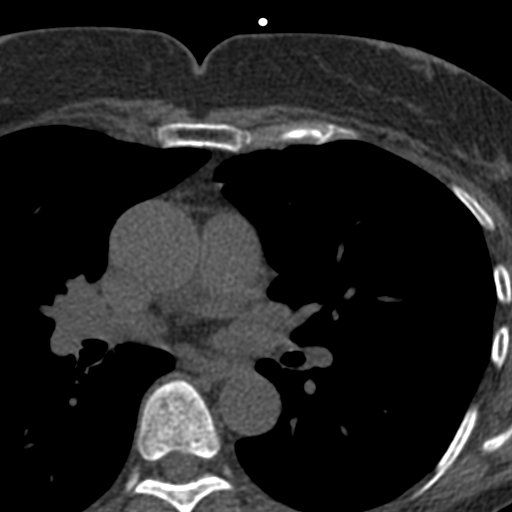

[Series 3: cascseq 2.0 bf37 st · axial · 0.65mm/px · z∈[-250,-150]mm · 5 of 76 slices shown, 7 images]
[im 13/76  vessel]
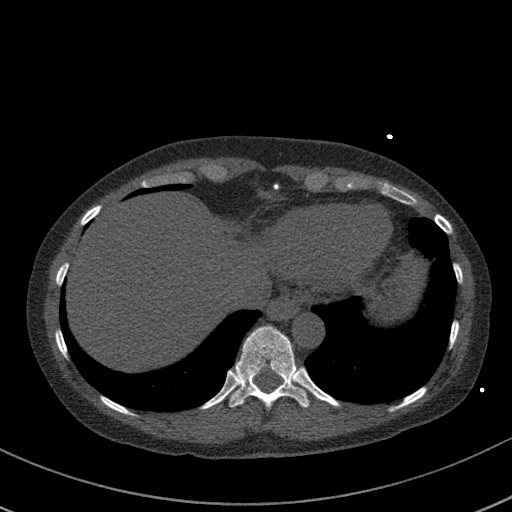
[im 13/76  lung]
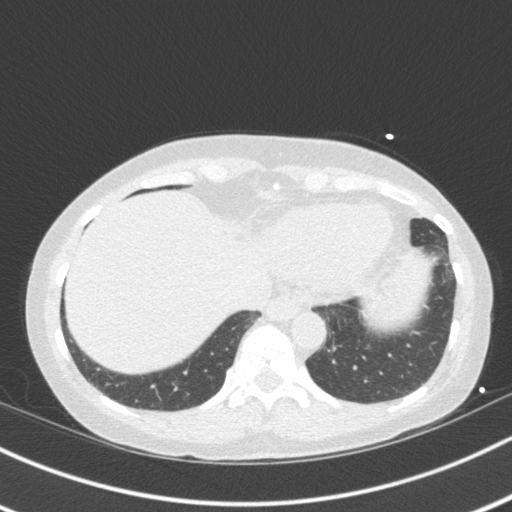
[im 26/76  vessel]
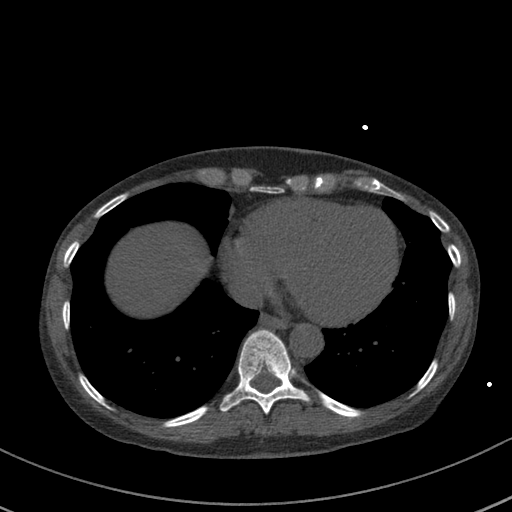
[im 38/76  vessel]
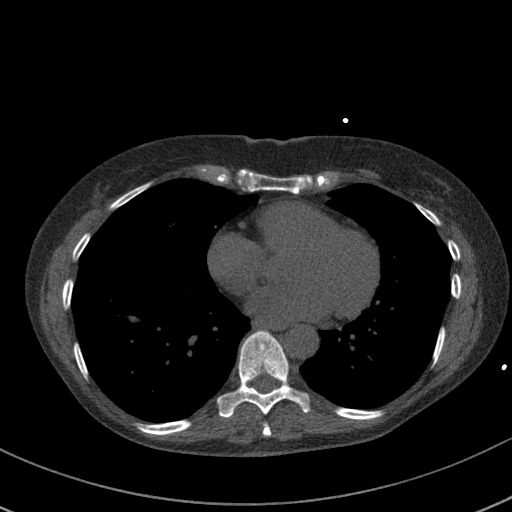
[im 51/76  vessel]
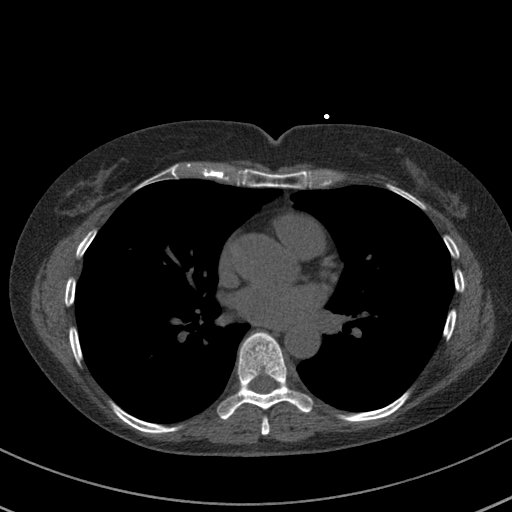
[im 63/76  vessel]
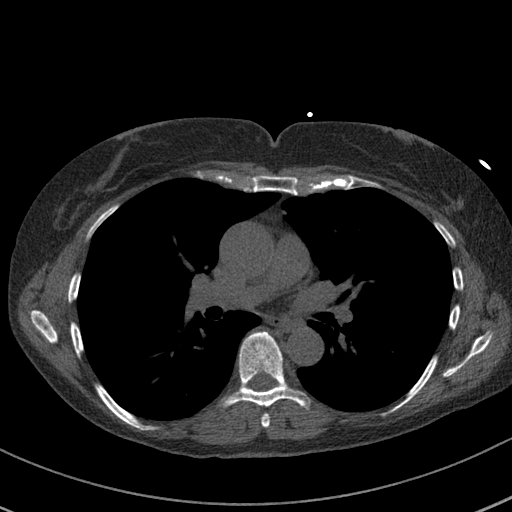
[im 63/76  lung]
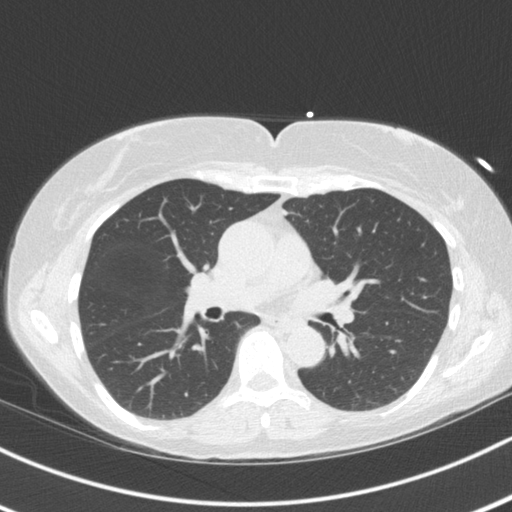

[Series 4: cascseq 2.0 br59 lung · axial · 0.65mm/px · z∈[-250,-150]mm · 5 of 76 slices shown]
[im 13/76  lung]
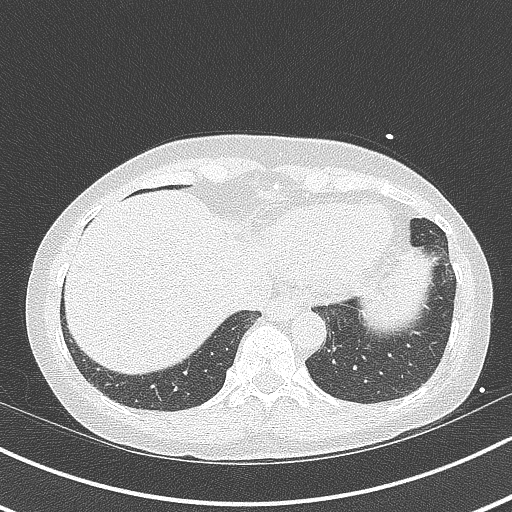
[im 26/76  lung]
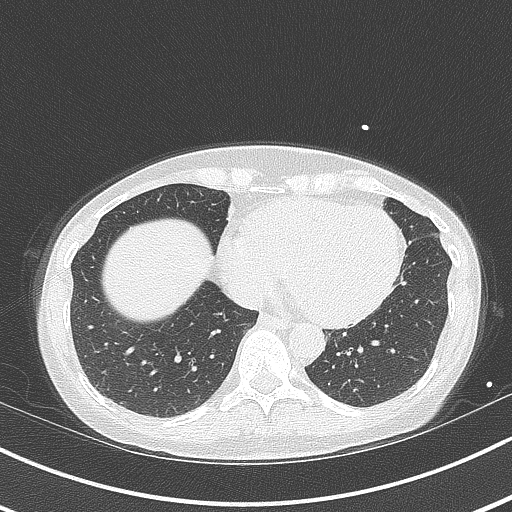
[im 38/76  lung]
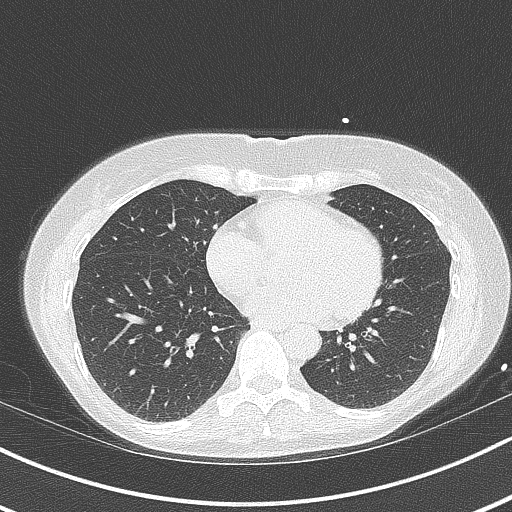
[im 51/76  lung]
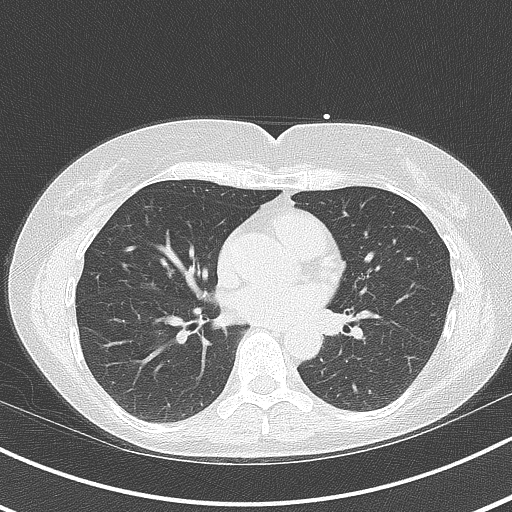
[im 63/76  lung]
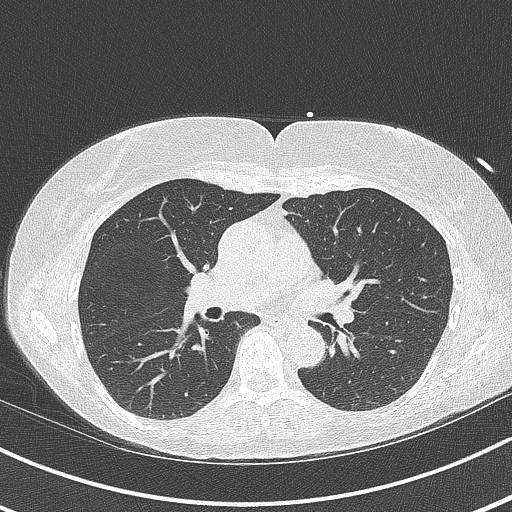

[14 of 20 positions shown; findings below may reference images not displayed]

FINDINGS: Vascular: Heart is normal size.  Aorta normal caliber.

Mediastinum/Nodes: No adenopathy

Lungs/Pleura: Calcified granuloma in the right middle lobe. No
confluent opacities or effusions.

Upper Abdomen: No acute findings

Musculoskeletal: ee Chest wall soft tissues are unremarkable. No
acute bony abnormality.
IMPRESSION: No acute or significant extracardiac abnormality.

ADDENDUM:
Cardiovascular Disease Risk stratification

EXAM:
Coronary Calcium Score
FINDINGS: Coronary arteries: Normal origins.

Coronary Calcium Score:

Left main: 0

Left anterior descending artery: 0

Left circumflex artery: 0

Right coronary artery: 0

Total: 0

Percentile: 0

Pericardium: Normal.

Ascending Aorta: Normal caliber.

Non-cardiac: See separate report from [REDACTED].
IMPRESSION: Coronary calcium score of 0. This was 0 percentile for age-, race-,
and sex-matched controls.



If CAC=0, it is reasonable to withhold statin therapy and reassess
in 5 to 10 years, as long as higher risk conditions are absent
(diabetes mellitus, family history of premature CHD in first degree
relatives (males <55 years; females <65 years), cigarette smoking,
or LDL >=190 mg/dL).

If CAC is 1 to 99, it is reasonable to initiate statin therapy for
patients >=55 years of age.

If CAC is >=100 or >=75th percentile, it is reasonable to initiate
statin therapy at any age.

Cardiology referral should be considered for patients with CAC
scores >=400 or >=75th percentile.

*7022 AHA/ACC/AACVPR/AAPA/ABC/SPELLETJES/BASSETT/TRISHA/[place]/ZEIDA/KEONTE/RUDI
Guideline on the Management of Blood Cholesterol: A Report of the
American College of Cardiology/American Heart Association Task Force
on Clinical Practice Guidelines. J Am Coll Cardiol.
4044;73(24):3848-3684.

Itz Juss Prezident, DO

The noncardiac portion of this study will be interpreted in separate
report by the radiologist.

*** End of Addendum ***
EXAM:
OVER-READ INTERPRETATION  CT CHEST

The following report is an over-read performed by radiologist Dr.
Matte Elisabetta [REDACTED] on 06/14/2021. This over-read
does not include interpretation of cardiac or coronary anatomy or
pathology. The coronary calcium score interpretation by the
cardiologist is attached.
FINDINGS: Vascular: Heart is normal size.  Aorta normal caliber.

Mediastinum/Nodes: No adenopathy

Lungs/Pleura: Calcified granuloma in the right middle lobe. No
confluent opacities or effusions.

Upper Abdomen: No acute findings

Musculoskeletal: ee Chest wall soft tissues are unremarkable. No
acute bony abnormality.
IMPRESSION: No acute or significant extracardiac abnormality.

## 2024-01-21 ENCOUNTER — Other Ambulatory Visit: Payer: Self-pay | Admitting: Cardiology

## 2024-02-20 ENCOUNTER — Other Ambulatory Visit: Payer: Self-pay | Admitting: Cardiology

## 2024-03-06 ENCOUNTER — Other Ambulatory Visit: Payer: Self-pay | Admitting: Cardiology

## 2024-03-09 NOTE — Progress Notes (Unsigned)
 " Cardiology Office Note:    Date:  03/12/2024   ID:  Renee Keith, Renee Keith 1965-06-25, MRN [id number]  PCP:  Darcel Pool, MD   [place] [facility] Prov[facility] Cardiologist:  Wilbert Bihari, MD     Referring MD: Darcel Pool, MD   Chief complaint: Annual follow-up     History of Present Illness:   Renee Keith is a 59 y.o. female with a hx of hypertension, HLD, and a strong family history of cancer presenting today for annual follow-up of chronic cardiac conditions.  Her mom died of ovarian cancer at 69. She is unaware of any cardiac history in first-degree relatives but a grandparent on her mom side had coronary disease. She has never smoked.  Previously maintained on losartan and and amlodipine  until she developed leg swelling.  Amlodipine  switched for spironolactone , eventually developed hypercalcemia and spironolactone  was stopped, losartan increased to 100 mg daily.  Previously her LDL was 158, with a goal of <100.  ZIO monitoring was ordered in the past due to elevated resting heart rate, this demonstrated PAT up to 6 minutes and occasional PVCs.  Started on Toprol -XL 25 mg daily.  Eventually developing fatigue and dizziness, Toprol  switched to nightly dosing instead of a.m.  Repeat calcium off diuretics continued to remain elevated at >11 with no known cause, followed by endocrinology.  Most recently evaluated with cardiology in July 2024, she was doing well at that time, no cardiovascular complaints, adherent to her medication regimen. Liver enzymes mildly elevated with her endocrinologist Dr. Faythe as seen in October 2025.  Presents independently, doing well from a cardiovascular standpoint. She denies chest pain, palpitations, dyspnea, orthopnea, n, v, dark/tarry/bloody stools, hematuria, dizziness, syncope, edema, weight gain.  She works as a building services engineer.  Freight Forwarder active by [employer] and routinely [employer].  Does not regularly monitor her blood pressure.  Has been following with  endocrinology managing her hypothyroidism and hypercalcemia.  Did not start Crestor 20 mg as recommended by Dr. Bihari following last set of lipids in 2024, patient states she was unaware she was prescribed medication.  She also states she is not interested in starting a statin until she has tried diet and lifestyle changes, and also prior to a discussion with Dr. Bihari regarding the need for statin therapy.  Reports she drinks 1-2 glasses of wine 3 days a week on average.  ROS:   Please see the history of present illness.    All other systems reviewed and are negative.     Past Medical History:  Diagnosis Date   Family history of adverse reaction to anesthesia    pt son-- ponv   Family history of breast cancer    Family history of melanoma    Family history of ovarian cancer    Family history of prostate cancer    Hypertension    followed by pcp  (per pt never had a stress test)   Hypothyroidism    followed by pcp   PONV (postoperative nausea and vomiting)    Wears contact lenses     Past Surgical History:  Procedure Laterality Date   BREAST IMPLANT REMOVAL  2014   BUNIONECTOMY Right 2018   PLACEMENT OF BREAST IMPLANTS Bilateral 2010   ROBOTIC ASSISTED SALPINGO OOPHERECTOMY Bilateral 11/11/2019   Procedure: XI ROBOTIC ASSISTED SALPINGO OOPHORECTOMY;  Surgeon: Darcel Pool, MD;  Location: [facility];  Service: Gynecology;  Laterality: Bilateral;   WISDOM TOOTH EXTRACTION     WRIST GANGLION EXCISION  Left 12-23-2018   @[facility]     Current Medications: Active Medications[1]   Allergies:   Penicillins, Bee venom, Elemental sulfur, and Sulfa antibiotics   Social History   Socioeconomic History   Marital status: Married    Spouse name: Not on file   Number of children: Not on file   Years of education: Not on file   Highest education level: Not on file  Occupational History   Not on file  Tobacco Use   Smoking status: Never   Smokeless tobacco: Never   Vaping Use   Vaping status: Never Used  Substance and Sexual Activity   Alcohol use: Yes    Comment: occasional   Drug use: Never   Sexual activity: Yes    Birth control/protection: None  Other Topics Concern   Not on file  Social History Narrative   Not on file   Social Drivers of Health   Tobacco Use: Low Risk (03/12/2024)   Patient History    Smoking Tobacco Use: Never    Smokeless Tobacco Use: Never    Passive Exposure: Not on file  Financial Resource Strain: Not on file  Food Insecurity: Not on file  Transportation Needs: Not on file  Physical Activity: Not on file  Stress: Not on file  Social Connections: Not on file  Depression (EYV7-0): Not on file  Alcohol Screen: Not on file  Housing: Not on file  Utilities: Not on file  Health Literacy: Not on file     Family History: The patient's family history includes Breast cancer (age of onset: 34) in her cousin; Breast cancer (age of onset: 46) in an other family member; Melanoma (age of onset: 39) in her brother; Ovarian cancer (age of onset: 46) in her mother; Prostate cancer (age of onset: 8) in her maternal uncle; Prostate cancer (age of onset: 82) in her maternal uncle; Prostate cancer (age of onset: 19) in her father.  EKGs/Labs/Other Studies Reviewed:    The following studies were reviewed today:  EKG Interpretation Date/Time:  Wednesday March 12 2024 10:02:28 EST Ventricular Rate:  72 PR Interval:  154 QRS Duration:  72 QT Interval:  408 QTC Calculation: 446 R Axis:   64  Text Interpretation: Normal sinus rhythm Normal ECG When compared with ECG of 25-Sep-2022 08:44, No significant change was found Confirmed by Donovon Micheletti [phone]) on 03/12/2024 10:23:45 AM    Recent Labs: No results found for requested labs within last 365 days.  Recent Lipid Panel    Component Value Date/Time   CHOL 235 (H) 10/27/2021 0941   TRIG 118 10/27/2021 0941   HDL 81 10/27/2021 0941   CHOLHDL 2.9 10/27/2021 0941    LDLCALC 134 (H) 10/27/2021 0941     Risk Assessment/Calculations:                Physical Exam:    VS:  BP 122/80   Pulse 72   LMP 03/15/2019   SpO2 95%        Wt Readings from Last 3 Encounters:  09/25/22 136 lb 9.6 oz (62 kg)  03/27/22 133 lb (60.3 kg)  01/23/22 134 lb (60.8 kg)     GEN:  Well nourished, well developed in no acute distress HEENT: Normal NECK: No carotid bruits CARDIAC:  S1-S2 normal, RRR, no murmurs, rubs, gallops RESPIRATORY:  Clear to auscultation without rales, wheezing or rhonchi  MUSCULOSKELETAL:  No edema; No deformity  SKIN: Warm and dry NEUROLOGIC:  Alert and oriented x 3 PSYCHIATRIC:  Normal affect       Assessment & Plan Primary hypertension Does not regularly check her BP at home States it is well-controlled when she goes to her various doctors appointments BP in clinic today 122/80 Denies dizziness, lightheadedness, near syncope, headache, flushing, visual changes Advised to check her blood pressure at least once weekly and notify the office if consistently above goal of 130/80 Continue irbesartan  300 mg daily, Toprol -XL 50 mg daily Pure hypercholesterolemia Considerably elevated lipids in 2024, Crestor 20 mg daily was recommended at that time Patient was unaware she was supposed to start this medication Recommended starting today, patient was not interested in starting statin until she tried diet and lifestyle changes and had a thorough discussion regarding statin therapy with Dr. Shlomo. Will update fasting lipids and LFTs today Provided information for healthy diet lifestyle options at discharge We will plan to repeat fasting lipids and LFTs at follow-up visit with Dr. Shlomo to assess whether lifestyle interventions, diet modification, alcohol reduction improve cholesterol reading States she would consider starting statin therapy if no change at that time Tachycardia, unspecified No further episodes of tachycardia or  palpitations EKG: 72 bpm NSR, no significant change from prior studies  Disposition: Follow-up in 3 months with Dr. Shlomo for further discussion of lipid management            Medication Adjustments/Labs and Tests Ordered: Current medicines are reviewed at length with the patient today.  Concerns regarding medicines are outlined above.  Orders Placed This Encounter  Procedures   Lipid Profile   Hepatic function panel   EKG 12-Lead   Meds ordered this encounter  Medications   irbesartan  (AVAPRO ) 300 MG tablet    Sig: Take 1 tablet (300 mg total) by mouth daily.    Dispense:  90 tablet    Refill:  3    Please keep pending appt on 1/14 w MARLA Shams for further refills. Thank you Final attempt   metoprolol  succinate (TOPROL -XL) 50 MG 24 hr tablet    Sig: Take 1 tablet (50 mg total) by mouth daily. BY MOUTH DAILY WITH OR IMMEDIATELY FOLLOWING A MEAL    Dispense:  90 tablet    Refill:  3    Please keep pending appt on 1/14 w MARLA Shams for further refills. Thank you Final attempt    Patient Instructions  Medication Instructions:  Your physician recommends that you continue on your current medications as directed. Please refer to the Current Medication list given to you today.  *If you need a refill on your cardiac medications before your next appointment, please call your pharmacy*  Labs Today-Fasting Lipids, LFTs   Follow-Up: At [facility], you and your health needs are our priority.  As part of our continuing mission to provide you with exceptional heart care, our providers are all part of one team.  This team includes your primary Cardiologist (physician) and Advanced Practice Providers or APPs (Physician Assistants and Nurse Practitioners) who all work together to provide you with the care you need, when you need it.  Your next appointment:   3 months  Provider:   Wilbert Shlomo, MD                Signed, Delenn Ahn E Deron Poole, NP  03/12/2024 10:58 AM     Renee Keith     [1]  No outpatient medications have been marked as taking for the 03/12/24 encounter (Office Visit) with Kasmira Cacioppo E, NP.   "

## 2024-03-12 ENCOUNTER — Ambulatory Visit: Attending: Cardiology | Admitting: Emergency Medicine

## 2024-03-12 ENCOUNTER — Encounter: Payer: Self-pay | Admitting: Emergency Medicine

## 2024-03-12 VITALS — BP 122/80 | HR 72 | Ht 66.0 in | Wt 134.0 lb

## 2024-03-12 DIAGNOSIS — R000 Tachycardia, unspecified: Secondary | ICD-10-CM

## 2024-03-12 DIAGNOSIS — I10 Essential (primary) hypertension: Secondary | ICD-10-CM | POA: Diagnosis not present

## 2024-03-12 DIAGNOSIS — E7800 Pure hypercholesterolemia, unspecified: Secondary | ICD-10-CM

## 2024-03-12 MED ORDER — IRBESARTAN 300 MG PO TABS: 300.0000 mg | ORAL_TABLET | Freq: Every day | ORAL | 3 refills | Status: AC

## 2024-03-12 MED ORDER — METOPROLOL SUCCINATE ER 50 MG PO TB24: 50.0000 mg | ORAL_TABLET | Freq: Every day | ORAL | 3 refills | Status: AC

## 2024-03-12 NOTE — Patient Instructions (Addendum)
 Medication Instructions:  Your physician recommends that you continue on your current medications as directed. Please refer to the Current Medication list given to you today.  *If you need a refill on your cardiac medications before your next appointment, please call your pharmacy*  Labs Today-Fasting Lipids, LFTs   Follow-Up: At [facility], you and your health needs are our priority.  As part of our continuing mission to provide you with exceptional heart care, our providers are all part of one team.  This team includes your primary Cardiologist (physician) and Advanced Practice Providers or APPs (Physician Assistants and Nurse Practitioners) who all work together to provide you with the care you need, when you need it.  Your next appointment:   3 months  Provider:   Wilbert Bihari, MD

## 2024-03-13 ENCOUNTER — Ambulatory Visit: Payer: Self-pay | Admitting: Emergency Medicine

## 2024-03-13 LAB — HEPATIC FUNCTION PANEL
ALT: 20 IU/L (ref 0–32)
AST: 31 IU/L (ref 0–40)
Albumin: 4.8 g/dL (ref 3.8–4.9)
Alkaline Phosphatase: 68 IU/L (ref 49–135)
Bilirubin Total: 1 mg/dL (ref 0.0–1.2)
Bilirubin, Direct: 0.29 mg/dL (ref 0.00–0.40)
Total Protein: 8 g/dL (ref 6.0–8.5)

## 2024-03-13 LAB — LIPID PANEL
Chol/HDL Ratio: 3.3 ratio (ref 0.0–4.4)
Cholesterol, Total: 240 mg/dL — ABNORMAL HIGH (ref 100–199)
HDL: 73 mg/dL
LDL Chol Calc (NIH): 131 mg/dL — ABNORMAL HIGH (ref 0–99)
Triglycerides: 208 mg/dL — ABNORMAL HIGH (ref 0–149)
VLDL Cholesterol Cal: 36 mg/dL (ref 5–40)

## 2024-06-11 ENCOUNTER — Ambulatory Visit: Admitting: Cardiology
